# Patient Record
Sex: Female | Born: 1945
Health system: Southern US, Community
[De-identification: ages and names within clinical notes are randomized; demographics above are authoritative.]

## PROBLEM LIST (undated history)

## (undated) DIAGNOSIS — J189 Pneumonia, unspecified organism: Secondary | ICD-10-CM

## (undated) DIAGNOSIS — R011 Cardiac murmur, unspecified: Secondary | ICD-10-CM

## (undated) DIAGNOSIS — I10 Essential (primary) hypertension: Secondary | ICD-10-CM

## (undated) DIAGNOSIS — G4730 Sleep apnea, unspecified: Secondary | ICD-10-CM

## (undated) DIAGNOSIS — F419 Anxiety disorder, unspecified: Secondary | ICD-10-CM

## (undated) DIAGNOSIS — M1990 Unspecified osteoarthritis, unspecified site: Secondary | ICD-10-CM

## (undated) DIAGNOSIS — E039 Hypothyroidism, unspecified: Secondary | ICD-10-CM

## (undated) DIAGNOSIS — J45909 Unspecified asthma, uncomplicated: Secondary | ICD-10-CM

## (undated) DIAGNOSIS — E079 Disorder of thyroid, unspecified: Secondary | ICD-10-CM

## (undated) DIAGNOSIS — E7800 Pure hypercholesterolemia, unspecified: Secondary | ICD-10-CM

## (undated) DIAGNOSIS — C801 Malignant (primary) neoplasm, unspecified: Secondary | ICD-10-CM

## (undated) HISTORY — PX: EYE SURGERY: SHX253

## (undated) HISTORY — PX: OTHER SURGICAL HISTORY: SHX169

## (undated) HISTORY — PX: TUBAL LIGATION: SHX77

## (undated) HISTORY — PX: SHOULDER SURGERY: SHX246

## (undated) HISTORY — PX: BREAST BIOPSY: SHX20

---

## 2011-08-14 DIAGNOSIS — E559 Vitamin D deficiency, unspecified: Secondary | ICD-10-CM | POA: Insufficient documentation

## 2011-08-14 DIAGNOSIS — E119 Type 2 diabetes mellitus without complications: Secondary | ICD-10-CM | POA: Insufficient documentation

## 2011-08-14 DIAGNOSIS — I10 Essential (primary) hypertension: Secondary | ICD-10-CM | POA: Insufficient documentation

## 2011-08-14 DIAGNOSIS — R7303 Prediabetes: Secondary | ICD-10-CM | POA: Insufficient documentation

## 2011-08-14 DIAGNOSIS — I340 Nonrheumatic mitral (valve) insufficiency: Secondary | ICD-10-CM | POA: Insufficient documentation

## 2011-08-14 DIAGNOSIS — E785 Hyperlipidemia, unspecified: Secondary | ICD-10-CM | POA: Insufficient documentation

## 2011-08-14 DIAGNOSIS — G8929 Other chronic pain: Secondary | ICD-10-CM | POA: Insufficient documentation

## 2011-08-14 DIAGNOSIS — G47419 Narcolepsy without cataplexy: Secondary | ICD-10-CM | POA: Insufficient documentation

## 2011-08-14 DIAGNOSIS — F419 Anxiety disorder, unspecified: Secondary | ICD-10-CM | POA: Insufficient documentation

## 2011-08-14 DIAGNOSIS — E039 Hypothyroidism, unspecified: Secondary | ICD-10-CM | POA: Insufficient documentation

## 2011-12-12 DIAGNOSIS — M79673 Pain in unspecified foot: Secondary | ICD-10-CM | POA: Insufficient documentation

## 2011-12-12 DIAGNOSIS — G8929 Other chronic pain: Secondary | ICD-10-CM | POA: Insufficient documentation

## 2011-12-12 DIAGNOSIS — E669 Obesity, unspecified: Secondary | ICD-10-CM | POA: Insufficient documentation

## 2011-12-12 DIAGNOSIS — E66811 Obesity, class 1: Secondary | ICD-10-CM | POA: Insufficient documentation

## 2012-07-16 DIAGNOSIS — H2513 Age-related nuclear cataract, bilateral: Secondary | ICD-10-CM | POA: Insufficient documentation

## 2012-09-02 DIAGNOSIS — I872 Venous insufficiency (chronic) (peripheral): Secondary | ICD-10-CM | POA: Insufficient documentation

## 2012-10-02 DIAGNOSIS — C801 Malignant (primary) neoplasm, unspecified: Secondary | ICD-10-CM

## 2012-10-02 DIAGNOSIS — C4491 Basal cell carcinoma of skin, unspecified: Secondary | ICD-10-CM

## 2012-10-02 HISTORY — DX: Basal cell carcinoma of skin, unspecified: C44.91

## 2012-10-02 HISTORY — DX: Malignant (primary) neoplasm, unspecified: C80.1

## 2013-02-26 DIAGNOSIS — C44319 Basal cell carcinoma of skin of other parts of face: Secondary | ICD-10-CM | POA: Insufficient documentation

## 2013-10-08 DIAGNOSIS — R0609 Other forms of dyspnea: Secondary | ICD-10-CM | POA: Insufficient documentation

## 2013-10-08 DIAGNOSIS — R0600 Dyspnea, unspecified: Secondary | ICD-10-CM | POA: Insufficient documentation

## 2013-10-08 DIAGNOSIS — R002 Palpitations: Secondary | ICD-10-CM | POA: Insufficient documentation

## 2014-03-19 DIAGNOSIS — H52209 Unspecified astigmatism, unspecified eye: Secondary | ICD-10-CM | POA: Insufficient documentation

## 2015-05-25 DIAGNOSIS — G250 Essential tremor: Secondary | ICD-10-CM | POA: Insufficient documentation

## 2015-11-03 DIAGNOSIS — R259 Unspecified abnormal involuntary movements: Secondary | ICD-10-CM

## 2015-11-03 DIAGNOSIS — G252 Other specified forms of tremor: Secondary | ICD-10-CM | POA: Insufficient documentation

## 2015-11-03 DIAGNOSIS — F32A Depression, unspecified: Secondary | ICD-10-CM | POA: Insufficient documentation

## 2015-11-03 DIAGNOSIS — F329 Major depressive disorder, single episode, unspecified: Secondary | ICD-10-CM | POA: Insufficient documentation

## 2016-04-12 DIAGNOSIS — H43813 Vitreous degeneration, bilateral: Secondary | ICD-10-CM | POA: Insufficient documentation

## 2017-02-09 ENCOUNTER — Emergency Department
Admission: EM | Admit: 2017-02-09 | Discharge: 2017-02-09 | Disposition: A | Discharge status: Home / Self Care | Payer: Medicare Other | Attending: Emergency Medicine | Admitting: Emergency Medicine

## 2017-02-09 ENCOUNTER — Encounter: Payer: Self-pay | Admitting: Emergency Medicine

## 2017-02-09 DIAGNOSIS — L509 Urticaria, unspecified: Secondary | ICD-10-CM

## 2017-02-09 DIAGNOSIS — I10 Essential (primary) hypertension: Secondary | ICD-10-CM | POA: Diagnosis not present

## 2017-02-09 DIAGNOSIS — T7849XA Other allergy, initial encounter: Secondary | ICD-10-CM | POA: Diagnosis not present

## 2017-02-09 DIAGNOSIS — R21 Rash and other nonspecific skin eruption: Secondary | ICD-10-CM | POA: Diagnosis present

## 2017-02-09 DIAGNOSIS — L500 Allergic urticaria: Secondary | ICD-10-CM | POA: Diagnosis not present

## 2017-02-09 DIAGNOSIS — J45998 Other asthma: Secondary | ICD-10-CM | POA: Diagnosis not present

## 2017-02-09 DIAGNOSIS — T7840XA Allergy, unspecified, initial encounter: Secondary | ICD-10-CM

## 2017-02-09 HISTORY — DX: Pure hypercholesterolemia, unspecified: E78.00

## 2017-02-09 HISTORY — DX: Anxiety disorder, unspecified: F41.9

## 2017-02-09 HISTORY — DX: Essential (primary) hypertension: I10

## 2017-02-09 HISTORY — DX: Unspecified asthma, uncomplicated: J45.909

## 2017-02-09 HISTORY — DX: Disorder of thyroid, unspecified: E07.9

## 2017-02-09 MED ORDER — DIPHENHYDRAMINE HCL 25 MG PO CAPS
50.0000 mg | ORAL_CAPSULE | Freq: Once | ORAL | Status: AC
  Administered 2017-02-09: 50 mg via ORAL
  Filled 2017-02-09 (×2): qty 2

## 2017-02-09 MED ORDER — DIPHENHYDRAMINE HCL 25 MG PO CAPS: 25.0000 mg | ORAL_CAPSULE | Freq: Four times a day (QID) | ORAL | 0 refills | Status: DC

## 2017-02-09 MED ORDER — FAMOTIDINE 20 MG PO TABS
40.0000 mg | ORAL_TABLET | Freq: Once | ORAL | Status: AC
  Administered 2017-02-09: 40 mg via ORAL
  Filled 2017-02-09: qty 2

## 2017-02-09 NOTE — ED Notes (Signed)
Pt verbalized understanding of discharge instructions. NAD at this time. 

## 2017-02-09 NOTE — ED Triage Notes (Signed)
Pt reports took 2 slimvance pills 20 min prior to arrival. Now has generalized rash and itching everywhere.  Feels tingling sensation to skin.  Cheeks flushed. Pt denies dyspnea or dysphagia. No respiratory distress.  Speaking full sentences. handeling secretions.  No swelling to face.

## 2017-02-09 NOTE — ED Notes (Signed)
Pt informed to return to first nurse if sx worsens or starts having difficulty breathing or swallowing.

## 2017-02-09 NOTE — ED Provider Notes (Signed)
[facility] Emergency Department Provider Note  ____________________________________________  Time seen: Approximately 2:57 PM  I have reviewed the triage vital signs and the nursing notes.   HISTORY  Chief Complaint Allergic Reaction    HPI Wendy Beck is a 71 y.o. female who complains of itching and red rash that broke out on her scan shortly after 11 AM today. She took a new weight loss supplement called Terrance Mass at that time and quickly developed this itching reaction. She had never taken it before. Her only prior known allergy is for niacin that she was taking for hyperlipidemia. It sounds like she had a typical flushing reaction with this.  Denies abdominal pain nausea vomiting or diarrhea. No shortness of breath or wheezing. No cough or throat swelling. On arrival to the ED she was given antihistamines and feels back to normal.   Past Medical History:  Diagnosis Date  . Anxiety   . Asthma   . Hypercholesteremia   . Hypertension   . Thyroid disease      There are no active problems to display for this patient.    Past Surgical History:  Procedure Laterality Date  . SHOULDER SURGERY    . thumb surgery       Prior to Admission medications   Medication Sig Start Date End Date Taking? Authorizing Provider  diphenhydrAMINE (BENADRYL) 25 mg capsule Take 1 capsule (25 mg total) by mouth every 6 (six) hours. 02/09/17 02/12/17  Carrie Mew, MD     Allergies Niacin and related   History reviewed. No pertinent family history.  Social History Social History  Substance Use Topics  . Smoking status: Never Smoker  . Smokeless tobacco: Never Used  . Alcohol use No    Review of Systems  Constitutional:   No fever or chills.  ENT:   No sore throat. No rhinorrhea. Lymphatic: No swollen glands, No extremity swelling Endocrine: No hot/cold flashes. No significant weight change. No neck swelling. Cardiovascular:   No chest pain or  syncope. Respiratory:   No dyspnea or cough. Gastrointestinal:   Negative for abdominal pain, vomiting and diarrhea.  Genitourinary:   Negative for dysuria or difficulty urinating. Musculoskeletal:   Negative for focal pain or swelling Neurological:   Negative for headaches or weakness. All other systems reviewed and are negative except as documented above in ROS and HPI.  ____________________________________________   PHYSICAL EXAM:  VITAL SIGNS: ED Triage Vitals  Enc Vitals Group     BP 02/09/17 1157 (!) 189/75     Pulse Rate 02/09/17 1154 (!) 57     Resp 02/09/17 1154 18     Temp 02/09/17 1154 97.8 F (36.6 C)     Temp Source 02/09/17 1154 Oral     SpO2 02/09/17 1154 99 %     Weight 02/09/17 1154 180 lb (81.6 kg)     Height 02/09/17 1154 5\' 2"  (1.575 m)     Head Circumference --      Peak Flow --      Pain Score --      Pain Loc --      Pain Edu? --      Excl. in [place]? --     Vital signs reviewed, nursing assessments reviewed.   Constitutional:   Alert and oriented. Well appearing and in no distress. Eyes:   No scleral icterus. No conjunctival pallor. PERRL. EOMI.  No nystagmus. ENT   Head:   Normocephalic and atraumatic.   Nose:  No congestion/rhinnorhea. No septal hematoma   Mouth/Throat:   MMM, no pharyngeal erythema. No peritonsillar mass.    Neck:   No stridor. No SubQ emphysema. No meningismus. Hematological/Lymphatic/Immunilogical:   No cervical lymphadenopathy. Cardiovascular:   RRR. Symmetric bilateral radial and DP pulses.  No murmurs.  Respiratory:   Normal respiratory effort without tachypnea nor retractions. Breath sounds are clear and equal bilaterally. No wheezes/rales/rhonchi.No inducible wheezing with forceful expiration Gastrointestinal:   Soft and nontender. Non distended. There is no CVA tenderness.  No rebound, rigidity, or guarding.Normoactive bowel sounds Genitourinary:   deferred Musculoskeletal:   Normal range of motion in all  extremities. No joint effusions.  No lower extremity tenderness.  No edema. Neurologic:   Normal speech and language.  CN 2-10 normal. Motor grossly intact. No gross focal neurologic deficits are appreciated.  Skin:    Skin is warm, dry and intact. No rash noted.  No petechiae, purpura, or bullae.  ____________________________________________    LABS (pertinent positives/negatives) (all labs ordered are listed, but only abnormal results are displayed) Labs Reviewed - No data to display ____________________________________________   EKG    ____________________________________________    RADIOLOGY  No results found.  ____________________________________________   PROCEDURES Procedures  ____________________________________________   INITIAL IMPRESSION / ASSESSMENT AND PLAN / ED COURSE  Pertinent labs & imaging results that were available during my care of the patient were reviewed by me and considered in my medical decision making (see chart for details).  Patient is well-appearing no acute distress. Symptoms have resolved with Benadryl and famotidine. Exam is reassuring and shows no evidence of hypersensitivity reaction at this time. So appears to been in very limited mild reaction which was easily resolved with antihistamines. Patient will stop taking this medication. I recommended a period of observation in the ED to ensure she does not have any relapse of symptoms, but she refuses. She wishes to be discharged right away. Given the minor degree to which her symptoms affected her and how easily there controlled think this is reasonable, I'll discharge her to continue on Benadryl for the next several days. Return precautions given. I don't think she would be at risk of a severe reaction like anaphylaxis.         ____________________________________________   FINAL CLINICAL IMPRESSION(S) / ED DIAGNOSES  Final diagnoses:  Allergic reaction, initial encounter   Urticaria      New Prescriptions   DIPHENHYDRAMINE (BENADRYL) 25 MG CAPSULE    Take 1 capsule (25 mg total) by mouth every 6 (six) hours.     Portions of this note were generated with dragon dictation software. Dictation errors may occur despite best attempts at proofreading.    Carrie Mew, MD 02/09/17 1500

## 2018-01-14 ENCOUNTER — Other Ambulatory Visit: Payer: Self-pay | Admitting: Physician Assistant

## 2018-01-14 DIAGNOSIS — M2391 Unspecified internal derangement of right knee: Secondary | ICD-10-CM

## 2018-01-24 ENCOUNTER — Ambulatory Visit
Admission: RE | Admit: 2018-01-24 | Discharge: 2018-01-24 | Disposition: A | Discharge status: Home / Self Care | Payer: Medicare Other | Source: Ambulatory Visit | Attending: Physician Assistant | Admitting: Physician Assistant

## 2018-01-24 DIAGNOSIS — M2391 Unspecified internal derangement of right knee: Secondary | ICD-10-CM

## 2018-01-24 DIAGNOSIS — M1711 Unilateral primary osteoarthritis, right knee: Secondary | ICD-10-CM | POA: Insufficient documentation

## 2018-01-24 DIAGNOSIS — S83221A Peripheral tear of medial meniscus, current injury, right knee, initial encounter: Secondary | ICD-10-CM | POA: Insufficient documentation

## 2018-01-24 DIAGNOSIS — X58XXXA Exposure to other specified factors, initial encounter: Secondary | ICD-10-CM | POA: Insufficient documentation

## 2018-03-22 ENCOUNTER — Encounter
Admission: RE | Admit: 2018-03-22 | Discharge: 2018-03-22 | Disposition: A | Discharge status: Home / Self Care | Payer: Medicare Other | Source: Ambulatory Visit | Attending: Orthopedic Surgery | Admitting: Orthopedic Surgery

## 2018-03-22 DIAGNOSIS — I10 Essential (primary) hypertension: Secondary | ICD-10-CM | POA: Diagnosis not present

## 2018-03-22 DIAGNOSIS — Z01812 Encounter for preprocedural laboratory examination: Secondary | ICD-10-CM | POA: Diagnosis present

## 2018-03-22 DIAGNOSIS — Z01810 Encounter for preprocedural cardiovascular examination: Secondary | ICD-10-CM | POA: Insufficient documentation

## 2018-03-22 HISTORY — DX: Cardiac murmur, unspecified: R01.1

## 2018-03-22 HISTORY — DX: Sleep apnea, unspecified: G47.30

## 2018-03-22 HISTORY — DX: Unspecified osteoarthritis, unspecified site: M19.90

## 2018-03-22 HISTORY — DX: Hypothyroidism, unspecified: E03.9

## 2018-03-22 HISTORY — DX: Malignant (primary) neoplasm, unspecified: C80.1

## 2018-03-22 HISTORY — DX: Pneumonia, unspecified organism: J18.9

## 2018-03-22 LAB — CBC
HCT: 41.9 % (ref 35.0–47.0)
Hemoglobin: 14.4 g/dL (ref 12.0–16.0)
MCH: 30.6 pg (ref 26.0–34.0)
MCHC: 34.4 g/dL (ref 32.0–36.0)
MCV: 89 fL (ref 80.0–100.0)
Platelets: 202 10*3/uL (ref 150–440)
RBC: 4.71 MIL/uL (ref 3.80–5.20)
RDW: 13.3 % (ref 11.5–14.5)
WBC: 7.5 10*3/uL (ref 3.6–11.0)

## 2018-03-22 NOTE — Patient Instructions (Signed)
  Your procedure is scheduled on: Friday 03/29/2018 Report to Same Day Surgery 2nd floor medical mall [facility] Entrance-take elevator on left to 2nd floor.  Check in with surgery information desk.) To find out your arrival time please call [phone] between 1PM - 3PM on Thursday 03/28/2018  Remember: Instructions that are not followed completely may result in serious medical risk, up to and including death, or upon the discretion of your surgeon and anesthesiologist your surgery may need to be rescheduled.    _x___ 1. Do not eat food after midnight the night before your procedure. You may drink clear liquids up to 2 hours before you are scheduled to arrive at the hospital for your procedure.  Do not drink clear liquids within 2 hours of your scheduled arrival to the hospital.  Clear liquids include  --Water or Apple juice without pulp  --Clear carbohydrate beverage such as Gatorade  --Black Coffee or Clear Tea (No milk, no creamers, do not add anything to the coffee or tea Type 1 and type 2 diabetics should only drink water.  No gum chewing or hard candies.      __x__ 2. No Alcohol for 24 hours before or after surgery.   __x__3. No Smoking or e-cigarettes for 24 prior to surgery.  Do not use any chewable tobacco products for at least 6 hour prior to surgery   ____  4. Bring all medications with you on the day of surgery if instructed.    __x__ 5. Notify your doctor if there is any change in your medical condition     (cold, fever, infections).   __x__6. On the morning of surgery brush your teeth with toothpaste and water.  You may rinse your mouth with mouth wash if you wish.  Do not swallow any toothpaste or mouthwash.   Do not wear jewelry, make-up, hairpins, clips or nail polish.  Do not wear lotions, powders, deodorant, or perfumes.   Do not shave 48 hours prior to surgery.   Do not bring valuables to the hospital.    [facility] is not responsible for any belongings or  valuables.               Contacts, dentures or bridgework may not be worn into surgery.  Leave your suitcase in the car. After surgery it may be brought to your room.  Please read over the following fact sheets that you were given:   [facility] Preparing for Surgery and or MRSA Information   _x___ Take anti-hypertensive listed below, cardiac, seizure, asthma, anti-reflux and psychiatric medicines. These include:  1. Levothyroxine/Synthroid  2. Fluoxetine/Prozac  3. Pitavastatin/Livalo  Do not take bisoprolol-HCTZ (Ziac) on the day of surgery.  _x___ Use CHG Soap or sage wipes as directed on instruction sheet   _x___ Follow recommendations from Cardiologist, Pulmonologist or PCP regarding stopping Aspirin, Coumadin, Plavix ,Eliquis, Effient, or Pradaxa, and Pletal.  _x___Stop Anti-inflammatories such as Advil, Aleve, Ibuprofen, Motrin, Naproxen, Naprosyn, Goodies powders or aspirin products. OK to take Tylenol and Celebrex.   _x___ Stop supplements (CoQ10, Glucosamine/Dona, Hair Vitamins, Fish Oil, Zyflamend) NOW until after surgery.  You may continue Vitamin D, and Vitamin B.

## 2018-03-29 ENCOUNTER — Ambulatory Visit: Payer: Medicare Other | Admitting: Anesthesiology

## 2018-03-29 ENCOUNTER — Other Ambulatory Visit: Payer: Self-pay

## 2018-03-29 ENCOUNTER — Encounter
Admission: RE | Disposition: A | Discharge status: Home / Self Care | Payer: Self-pay | Source: Ambulatory Visit | Attending: Orthopedic Surgery

## 2018-03-29 ENCOUNTER — Encounter: Payer: Self-pay | Admitting: Orthopedic Surgery

## 2018-03-29 ENCOUNTER — Ambulatory Visit
Admission: RE | Admit: 2018-03-29 | Discharge: 2018-03-29 | Disposition: A | Discharge status: Home / Self Care | Payer: Medicare Other | Source: Ambulatory Visit | Attending: Orthopedic Surgery | Admitting: Orthopedic Surgery

## 2018-03-29 DIAGNOSIS — E785 Hyperlipidemia, unspecified: Secondary | ICD-10-CM | POA: Diagnosis not present

## 2018-03-29 DIAGNOSIS — F419 Anxiety disorder, unspecified: Secondary | ICD-10-CM | POA: Insufficient documentation

## 2018-03-29 DIAGNOSIS — F329 Major depressive disorder, single episode, unspecified: Secondary | ICD-10-CM | POA: Insufficient documentation

## 2018-03-29 DIAGNOSIS — Y92099 Unspecified place in other non-institutional residence as the place of occurrence of the external cause: Secondary | ICD-10-CM | POA: Diagnosis not present

## 2018-03-29 DIAGNOSIS — S83241A Other tear of medial meniscus, current injury, right knee, initial encounter: Secondary | ICD-10-CM | POA: Diagnosis not present

## 2018-03-29 DIAGNOSIS — I10 Essential (primary) hypertension: Secondary | ICD-10-CM | POA: Diagnosis not present

## 2018-03-29 DIAGNOSIS — M94261 Chondromalacia, right knee: Secondary | ICD-10-CM | POA: Insufficient documentation

## 2018-03-29 DIAGNOSIS — E039 Hypothyroidism, unspecified: Secondary | ICD-10-CM | POA: Diagnosis not present

## 2018-03-29 DIAGNOSIS — W109XXA Fall (on) (from) unspecified stairs and steps, initial encounter: Secondary | ICD-10-CM | POA: Diagnosis not present

## 2018-03-29 DIAGNOSIS — Z9889 Other specified postprocedural states: Secondary | ICD-10-CM

## 2018-03-29 DIAGNOSIS — Z79899 Other long term (current) drug therapy: Secondary | ICD-10-CM | POA: Diagnosis not present

## 2018-03-29 HISTORY — PX: KNEE ARTHROSCOPY: SHX127

## 2018-03-29 SURGERY — ARTHROSCOPY KNEE
Anesthesia: General | Site: Knee | Laterality: Right | Wound class: Clean

## 2018-03-29 MED ORDER — FENTANYL CITRATE (PF) 100 MCG/2ML IJ SOLN
INTRAMUSCULAR | Status: AC
  Filled 2018-03-29: qty 2

## 2018-03-29 MED ORDER — LIDOCAINE HCL (CARDIAC) PF 100 MG/5ML IV SOSY
PREFILLED_SYRINGE | INTRAVENOUS | Status: DC | PRN
  Administered 2018-03-29: 60 mg via INTRAVENOUS

## 2018-03-29 MED ORDER — OXYCODONE HCL 5 MG PO TABS
ORAL_TABLET | ORAL | Status: AC
  Filled 2018-03-29: qty 1

## 2018-03-29 MED ORDER — FAMOTIDINE 20 MG PO TABS
ORAL_TABLET | ORAL | Status: AC
  Administered 2018-03-29: 20 mg via ORAL
  Filled 2018-03-29: qty 1

## 2018-03-29 MED ORDER — FAMOTIDINE 20 MG PO TABS
20.0000 mg | ORAL_TABLET | Freq: Once | ORAL | Status: AC
  Administered 2018-03-29: 20 mg via ORAL

## 2018-03-29 MED ORDER — LACTATED RINGERS IV SOLN
INTRAVENOUS | Status: DC
  Administered 2018-03-29 (×2): via INTRAVENOUS

## 2018-03-29 MED ORDER — FENTANYL CITRATE (PF) 100 MCG/2ML IJ SOLN
25.0000 ug | INTRAMUSCULAR | Status: DC | PRN
  Administered 2018-03-29 (×2): 25 ug via INTRAVENOUS

## 2018-03-29 MED ORDER — BUPIVACAINE-EPINEPHRINE (PF) 0.25% -1:200000 IJ SOLN
INTRAMUSCULAR | Status: AC
  Filled 2018-03-29: qty 30

## 2018-03-29 MED ORDER — ACETAMINOPHEN 10 MG/ML IV SOLN
INTRAVENOUS | Status: AC
  Filled 2018-03-29: qty 100

## 2018-03-29 MED ORDER — MIDAZOLAM HCL 2 MG/2ML IJ SOLN
INTRAMUSCULAR | Status: DC | PRN
  Administered 2018-03-29: 2 mg via INTRAVENOUS

## 2018-03-29 MED ORDER — MORPHINE SULFATE (PF) 4 MG/ML IV SOLN
INTRAVENOUS | Status: AC
  Filled 2018-03-29: qty 1

## 2018-03-29 MED ORDER — FENTANYL CITRATE (PF) 100 MCG/2ML IJ SOLN
INTRAMUSCULAR | Status: AC
  Administered 2018-03-29: 25 ug via INTRAVENOUS
  Filled 2018-03-29: qty 2

## 2018-03-29 MED ORDER — PROPOFOL 10 MG/ML IV BOLUS
INTRAVENOUS | Status: AC
  Filled 2018-03-29: qty 20

## 2018-03-29 MED ORDER — DEXAMETHASONE SODIUM PHOSPHATE 10 MG/ML IJ SOLN
INTRAMUSCULAR | Status: DC | PRN
  Administered 2018-03-29: 4 mg via INTRAVENOUS

## 2018-03-29 MED ORDER — FENTANYL CITRATE (PF) 100 MCG/2ML IJ SOLN
INTRAMUSCULAR | Status: DC | PRN
  Administered 2018-03-29: 50 ug via INTRAVENOUS
  Administered 2018-03-29: 25 ug via INTRAVENOUS
  Administered 2018-03-29 (×2): 50 ug via INTRAVENOUS
  Administered 2018-03-29: 25 ug via INTRAVENOUS

## 2018-03-29 MED ORDER — OXYCODONE HCL 5 MG PO TABS
5.0000 mg | ORAL_TABLET | Freq: Once | ORAL | Status: AC | PRN
  Administered 2018-03-29: 5 mg via ORAL

## 2018-03-29 MED ORDER — ACETAMINOPHEN 10 MG/ML IV SOLN
INTRAVENOUS | Status: DC | PRN
  Administered 2018-03-29: 1000 mg via INTRAVENOUS

## 2018-03-29 MED ORDER — MORPHINE SULFATE 4 MG/ML IJ SOLN
INTRAMUSCULAR | Status: DC | PRN
  Administered 2018-03-29: 4 mg

## 2018-03-29 MED ORDER — EPHEDRINE SULFATE 50 MG/ML IJ SOLN
INTRAMUSCULAR | Status: DC | PRN
  Administered 2018-03-29: 5 mg via INTRAVENOUS
  Administered 2018-03-29: 10 mg via INTRAVENOUS

## 2018-03-29 MED ORDER — BUPIVACAINE-EPINEPHRINE 0.25% -1:200000 IJ SOLN
INTRAMUSCULAR | Status: DC | PRN
  Administered 2018-03-29: 25 mL
  Administered 2018-03-29: 5 mL

## 2018-03-29 MED ORDER — HYDROCODONE-ACETAMINOPHEN 5-325 MG PO TABS: 1.0000 | ORAL_TABLET | ORAL | 0 refills | Status: DC | PRN

## 2018-03-29 MED ORDER — OXYCODONE HCL 5 MG/5ML PO SOLN: 5.0000 mg | Freq: Once | ORAL | Status: AC | PRN

## 2018-03-29 MED ORDER — CHLORHEXIDINE GLUCONATE 4 % EX LIQD: 60.0000 mL | Freq: Once | CUTANEOUS | Status: DC

## 2018-03-29 MED ORDER — ONDANSETRON HCL 4 MG/2ML IJ SOLN
INTRAMUSCULAR | Status: DC | PRN
  Administered 2018-03-29: 4 mg via INTRAVENOUS

## 2018-03-29 MED ORDER — PROPOFOL 10 MG/ML IV BOLUS
INTRAVENOUS | Status: DC | PRN
  Administered 2018-03-29: 60 mg via INTRAVENOUS

## 2018-03-29 MED ORDER — MIDAZOLAM HCL 2 MG/2ML IJ SOLN
INTRAMUSCULAR | Status: AC
  Filled 2018-03-29: qty 2

## 2018-03-29 SURGICAL SUPPLY — 21 items
BLADE SHAVER 4.5 DBL SERAT CV (CUTTER)
CUFF TOURN 24 STER (MISCELLANEOUS) ×2
CUFF TOURN 30 STER DUAL PORT (MISCELLANEOUS)
DRSG DERMACEA 8X12 NADH (GAUZE/BANDAGES/DRESSINGS) ×2
DURAPREP 26ML APPLICATOR (WOUND CARE) ×4
GAUZE SPONGE 4X4 12PLY STRL (GAUZE/BANDAGES/DRESSINGS) ×2
GLOVE BIOGEL M STRL SZ7.5 (GLOVE) ×2
GLOVE INDICATOR 8.0 STRL GRN (GLOVE) ×2
GOWN STRL REUS W/ TWL LRG LVL3 (GOWN DISPOSABLE) ×2
GOWN STRL REUS W/TWL LRG LVL3 (GOWN DISPOSABLE) ×2
IV LACTATED RINGER IRRG 3000ML (IV SOLUTION) ×6
IV LR IRRIG 3000ML ARTHROMATIC (IV SOLUTION) ×6
KIT TURNOVER KIT A (KITS) ×2
MANIFOLD NEPTUNE II (INSTRUMENTS) ×2
PACK ARTHROSCOPY KNEE (MISCELLANEOUS) ×2
SET TUBE SUCT SHAVER OUTFL 24K (TUBING) ×2
SET TUBE TIP INTRA-ARTICULAR (MISCELLANEOUS) ×2
SUT ETHILON 3-0 FS-10 30 BLK (SUTURE) ×2
TUBING ARTHRO INFLOW-ONLY STRL (TUBING) ×2
WAND HAND CNTRL MULTIVAC 50 (MISCELLANEOUS) ×2
WRAP KNEE W/COLD PACKS 25.5X14 (SOFTGOODS) ×2

## 2018-03-29 NOTE — Anesthesia Post-op Follow-up Note (Signed)
Anesthesia QCDR form completed.        

## 2018-03-29 NOTE — Progress Notes (Signed)
Right knee elevated on pillows  Ice wrap on  Pedal pulse positive to right foot  Can wiggle right extremety

## 2018-03-29 NOTE — H&P (Signed)
The patient has been re-examined, and the chart reviewed, and there have been no interval changes to the documented history and physical.    The risks, benefits, and alternatives have been discussed at length. The patient expressed understanding of the risks benefits and agreed with plans for surgical intervention.  James P. Hooten, Jr. M.D.    

## 2018-03-29 NOTE — OR Nursing (Signed)
Patient instructed on use of incentive spirometer with return demonstration. 

## 2018-03-29 NOTE — Discharge Instructions (Signed)
°  Instructions after Knee Arthroscopy  ° ° Wendy Beck, Jr., M.D.    ° Dept. of Orthopaedics & Sports Medicine ° [facility] ° [address] ° [place], [place]  [address] ° ° Phone: [phone]   Fax: [phone] ° ° °DIET: °• Drink plenty of non-alcoholic fluids & begin a light diet. °• Resume your normal diet the day after surgery. ° °ACTIVITY:  °• You may use crutches or a walker with weight-bearing as tolerated, unless instructed otherwise. °• You may wean yourself off of the walker or crutches as tolerated.  °• Begin doing gentle exercises. Exercising will reduce the pain and swelling, increase motion, and prevent muscle weakness.   °• Avoid strenuous activities or athletics for a minimum of 4-6 weeks after arthroscopic surgery. °• Do not drive or operate any equipment until instructed. ° °WOUND CARE:  °• Place one to two pillows under the knee the first day or two when sitting or lying.  °• Continue to use the ice packs periodically to reduce pain and swelling. °• The small incisions in your knee are closed with nylon stitches. The stitches will be removed in the office. °• The bulky dressing may be removed on the second day after surgery. DO NOT TOUCH THE STITCHES. Put a Band-Aid over each stitch. Do NOT use any ointments or creams on the incisions.  °• You may bathe or shower after the stitches are removed at the first office visit following surgery. ° °MEDICATIONS: °• You may resume your regular medications. °• Please take the pain medication as prescribed. °• Do not take pain medication on an empty stomach. °• Do not drive or drink alcoholic beverages when taking pain medications. ° °CALL THE OFFICE FOR: °• Temperature above 101 degrees °• Excessive bleeding or drainage on the dressing. °• Excessive swelling, coldness, or paleness of the toes. °• Persistent nausea and vomiting. ° °FOLLOW-UP:  °• You should have an appointment to return to the office in 7-10 days after surgery.  °  °

## 2018-03-29 NOTE — Op Note (Signed)
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   ASSISTANT: none  ANESTHESIA: general  ESTIMATED BLOOD LOSS: Minimal  FLUIDS REPLACED: 800 mL of crystalloid  TOURNIQUET TIME: Not used  DRAINS: none  IMPLANTS UTILIZED: None  INDICATIONS FOR SURGERY: Wendy Beck is a 72 y.o. year old female who has been seen for complaints of right knee pain. MRI demonstrated findings consistent with meniscal pathology. After discussion of the risks and benefits of surgical intervention, the patient expressed understanding of the risks benefits and agree with plans for right knee arthroscopy.   PROCEDURE IN DETAIL: The patient was brought into the operating room and, after adequate spinal anesthesia was achieved, a tourniquet was applied to the right thigh and the leg was placed in the leg holder. All bony prominences were well padded. The patient's right knee was cleaned and prepped with alcohol and Duraprep and draped in the usual sterile fashion. A "timeout" was performed as per usual protocol. The anticipated portal sites were injected with 0.25% Marcaine with epinephrine. An anterolateral incision was made and a cannula was inserted. A large effusion was evacuated and the knee was distended with fluid using the pump. The scope was advanced down the medial gutter into the medial compartment. Under visualization with the scope, an anteromedial portal was created and a hooked probe was inserted. The medial meniscus was visualized and probed.  There was a complex tear of the posterior horn of the  medial meniscus.  The tear was debrided using meniscal punches and a 4.5 mm incisor shaver.  Final contouring was performed using the 50 degree ArthroCare wand.  The remaining portion of meniscus was visualized and probed and felt to be stable.  The articular cartilage was visualized.  There were some grade 3 changes of chondromalacia involving the medial femoral condyle.  These areas were debrided and contoured using the ArthroCare wand.  The scope was then advanced into the intercondylar notch. The anterior cruciate ligament was visualized and probed and felt to be intact. The scope was removed from the lateral portal and reinserted via the anteromedial portal to better visualize the lateral compartment. The lateral meniscus was visualized and probed.  The lateral meniscus was intact without evidence of significant tear or instability.  The articular cartilage of the lateral compartment was visualized and noted to be in good condition.  Finally, the scope was advanced so as to visualize the patellofemoral articulation. Good patellar tracking was appreciated.   The knee was irrigated with copius amounts of fluid and suctioned dry. The anterolateral portal was re-approximated with #3-0 nylon. A combination of 0.25% Marcaine with epinephrine and 4 mg of Morphine were injected via the scope. The scope was removed and the anteromedial portal was re-approximated with #3-0 nylon. A sterile dressing was applied followed by application of an ice wrap.  The patient tolerated the procedure well and was transported to the PACU in stable condition.  James P. Holley Bouche., M.D.

## 2018-03-29 NOTE — Anesthesia Preprocedure Evaluation (Signed)
Anesthesia Evaluation  Patient identified by MRN, date of birth, ID band Patient awake    Reviewed: Allergy & Precautions, H&P , NPO status , Patient's Chart, lab work & pertinent test results  History of Anesthesia Complications (+) history of anesthetic complications (broken teeth)  Airway Mallampati: III  TM Distance: <3 FB Neck ROM: limited    Dental  (+) Chipped, Poor Dentition, Caps, Implants   Pulmonary neg shortness of breath, asthma , sleep apnea , pneumonia,           Cardiovascular Exercise Tolerance: Good hypertension, (-) DOE + Valvular Problems/Murmurs      Neuro/Psych PSYCHIATRIC DISORDERS Anxiety Depression negative neurological ROS     GI/Hepatic negative GI ROS, Neg liver ROS,   Endo/Other  Hypothyroidism   Renal/GU      Musculoskeletal  (+) Arthritis ,   Abdominal   Peds  Hematology negative hematology ROS (+)   Anesthesia Other Findings Past Medical History: No date: Anxiety No date: Arthritis No date: Asthma 2014: Cancer ([place])     Comment:  BCC forehead No date: Heart murmur     Comment:  as a child, and currently No date: Hypercholesteremia No date: Hypertension No date: Hypothyroidism No date: Pneumonia No date: Sleep apnea No date: Thyroid disease  Past Surgical History: No date: SHOULDER SURGERY No date: thumb surgery  BMI    Body Mass Index:  32.98 kg/m      Reproductive/Obstetrics negative OB ROS                             Anesthesia Physical Anesthesia Plan  ASA: III  Anesthesia Plan: General LMA   Post-op Pain Management:    Induction: Intravenous  PONV Risk Score and Plan: Ondansetron, Dexamethasone, Midazolam and Treatment may vary due to age or medical condition  Airway Management Planned: LMA  Additional Equipment:   Intra-op Plan:   Post-operative Plan: Extubation in OR  Informed Consent: I have reviewed the patients  History and Physical, chart, labs and discussed the procedure including the risks, benefits and alternatives for the proposed anesthesia with the patient or authorized representative who has indicated his/her understanding and acceptance.   Dental Advisory Given  Plan Discussed with: Anesthesiologist, CRNA and Surgeon  Anesthesia Plan Comments: (Patient consented for risks of anesthesia including but not limited to:  - adverse reactions to medications - damage to teeth, lips or other oral mucosa - sore throat or hoarseness - Damage to heart, brain, lungs or loss of life  Patient voiced understanding.)        Anesthesia Quick Evaluation

## 2018-03-29 NOTE — Anesthesia Postprocedure Evaluation (Signed)
Anesthesia Post Note  Patient: Wendy Beck  Procedure(s) Performed: ARTHROSCOPY KNEE, PARTIAL MEDIAL MENISECTOMY, MEDIAL CHONDROPLASTY (Right Knee)  Patient location during evaluation: PACU Anesthesia Type: General Level of consciousness: awake and alert Pain management: pain level controlled Vital Signs Assessment: post-procedure vital signs reviewed and stable Respiratory status: spontaneous breathing, nonlabored ventilation, respiratory function stable and patient connected to nasal cannula oxygen Cardiovascular status: blood pressure returned to baseline and stable Postop Assessment: no apparent nausea or vomiting Anesthetic complications: no     Last Vitals:  Vitals:   03/29/18 0957 03/29/18 1029  BP: 129/74 126/64  Pulse: 70 65  Resp: 16   Temp: (!) 36.2 C   SpO2: 94% 93%    Last Pain:  Vitals:   03/29/18 1029  TempSrc:   PainSc: 5                  Precious Haws Takima [place]

## 2018-03-29 NOTE — Transfer of Care (Signed)
Immediate Anesthesia Transfer of Care Note  Patient: Wendy Beck  Procedure(s) Performed: ARTHROSCOPY KNEE, PARTIAL MEDIAL MENISECTOMY, MEDIAL CHONDROPLASTY (Right Knee)  Patient Location: PACU  Anesthesia Type:General  Level of Consciousness: awake  Airway & Oxygen Therapy: Patient Spontanous Breathing  Post-op Assessment: Report given to RN  Post vital signs: stable  Last Vitals:  Vitals Value Taken Time  BP 132/54 03/29/2018  8:54 AM  Temp    Pulse 83 03/29/2018  8:54 AM  Resp 11 03/29/2018  8:54 AM  SpO2 100 % 03/29/2018  8:54 AM  Vitals shown include unvalidated device data.  Last Pain:  Vitals:   03/29/18 0601  TempSrc:   PainSc: 0-No pain         Complications: No apparent anesthesia complications

## 2019-03-31 ENCOUNTER — Other Ambulatory Visit: Payer: Self-pay

## 2019-03-31 ENCOUNTER — Emergency Department
Admission: EM | Admit: 2019-03-31 | Discharge: 2019-03-31 | Disposition: A | Discharge status: Home / Self Care | Payer: Medicare Other | Attending: Emergency Medicine | Admitting: Emergency Medicine

## 2019-03-31 ENCOUNTER — Emergency Department: Payer: Medicare Other

## 2019-03-31 DIAGNOSIS — R1013 Epigastric pain: Secondary | ICD-10-CM | POA: Diagnosis not present

## 2019-03-31 DIAGNOSIS — Z79899 Other long term (current) drug therapy: Secondary | ICD-10-CM | POA: Diagnosis not present

## 2019-03-31 DIAGNOSIS — I10 Essential (primary) hypertension: Secondary | ICD-10-CM | POA: Diagnosis not present

## 2019-03-31 DIAGNOSIS — Z85828 Personal history of other malignant neoplasm of skin: Secondary | ICD-10-CM | POA: Insufficient documentation

## 2019-03-31 DIAGNOSIS — J45909 Unspecified asthma, uncomplicated: Secondary | ICD-10-CM | POA: Insufficient documentation

## 2019-03-31 DIAGNOSIS — E039 Hypothyroidism, unspecified: Secondary | ICD-10-CM | POA: Insufficient documentation

## 2019-03-31 LAB — COMPREHENSIVE METABOLIC PANEL WITH GFR
ALT: 70 U/L — ABNORMAL HIGH (ref 0–44)
AST: 64 U/L — ABNORMAL HIGH (ref 15–41)
Albumin: 3.2 g/dL — ABNORMAL LOW (ref 3.5–5.0)
Alkaline Phosphatase: 96 U/L (ref 38–126)
Anion gap: 9 (ref 5–15)
BUN: 17 mg/dL (ref 8–23)
CO2: 24 mmol/L (ref 22–32)
Calcium: 8.5 mg/dL — ABNORMAL LOW (ref 8.9–10.3)
Chloride: 100 mmol/L (ref 98–111)
Creatinine, Ser: 0.89 mg/dL (ref 0.44–1.00)
GFR calc Af Amer: >60 mL/min
GFR calc non Af Amer: >60 mL/min
Glucose, Bld: 169 mg/dL — ABNORMAL HIGH (ref 70–99)
Potassium: 3.4 mmol/L — ABNORMAL LOW (ref 3.5–5.1)
Sodium: 133 mmol/L — ABNORMAL LOW (ref 135–145)
Total Bilirubin: 1.1 mg/dL (ref 0.3–1.2)
Total Protein: 6.6 g/dL (ref 6.5–8.1)

## 2019-03-31 LAB — CBC WITH DIFFERENTIAL/PLATELET
Abs Immature Granulocytes: 0.05 10*3/uL (ref 0.00–0.07)
Basophils Absolute: 0 10*3/uL (ref 0.0–0.1)
Basophils Relative: 0 %
Eosinophils Absolute: 0.2 10*3/uL (ref 0.0–0.5)
Eosinophils Relative: 2 %
HCT: 39.1 % (ref 36.0–46.0)
Hemoglobin: 13.4 g/dL (ref 12.0–15.0)
Immature Granulocytes: 1 %
Lymphocytes Relative: 11 %
Lymphs Abs: 1.2 10*3/uL (ref 0.7–4.0)
MCH: 30.3 pg (ref 26.0–34.0)
MCHC: 34.3 g/dL (ref 30.0–36.0)
MCV: 88.5 fL (ref 80.0–100.0)
Monocytes Absolute: 0.9 10*3/uL (ref 0.1–1.0)
Monocytes Relative: 8 %
Neutro Abs: 8.5 10*3/uL — ABNORMAL HIGH (ref 1.7–7.7)
Neutrophils Relative %: 78 %
Platelets: 187 10*3/uL (ref 150–400)
RBC: 4.42 MIL/uL (ref 3.87–5.11)
RDW: 12.6 % (ref 11.5–15.5)
WBC: 10.8 10*3/uL — ABNORMAL HIGH (ref 4.0–10.5)
nRBC: 0 % (ref 0.0–0.2)

## 2019-03-31 LAB — LIPASE, BLOOD: Lipase: 50 U/L (ref 11–51)

## 2019-03-31 MED ORDER — FENTANYL CITRATE (PF) 100 MCG/2ML IJ SOLN
50.0000 ug | Freq: Once | INTRAMUSCULAR | Status: AC
  Administered 2019-03-31: 50 ug via INTRAVENOUS
  Filled 2019-03-31: qty 2

## 2019-03-31 MED ORDER — SUCRALFATE 1 G PO TABS: 1.0000 g | ORAL_TABLET | Freq: Four times a day (QID) | ORAL | 0 refills | Status: DC

## 2019-03-31 MED ORDER — SODIUM CHLORIDE 0.9 % IV SOLN
Freq: Once | INTRAVENOUS | Status: AC
  Administered 2019-03-31: 09:00:00 via INTRAVENOUS

## 2019-03-31 MED ORDER — FAMOTIDINE 20 MG PO TABS: 20.0000 mg | ORAL_TABLET | Freq: Every day | ORAL | 1 refills | Status: DC

## 2019-03-31 NOTE — ED Provider Notes (Signed)
[facility] Emergency Department Provider Note   ____________________________________________   I have reviewed the triage vital signs and the nursing notes.   HISTORY  Chief Complaint Abdominal Pain   History limited by: Not Limited   HPI Wendy Beck is a 73 y.o. female who presents to the emergency department today because of concern for epigastric pain. The patient states that the pain started 4 days ago. It does radiate to her back. The patient states that her appetite has been severely decreased as well. When she does try to eat the pain is much worse. Has not had a bowel movement during this time but has been passing gas. Denies any fevers. Has never had pain like this before. Denies history of abdominal surgeries in the past.    Records reviewed. Per medical record review patient has a history of HTN, HLD.   Past Medical History:  Diagnosis Date  . Anxiety   . Arthritis   . Asthma   . Cancer ([place]) 2014   BCC forehead  . Heart murmur    as a child, and currently  . Hypercholesteremia   . Hypertension   . Hypothyroidism   . Pneumonia   . Sleep apnea   . Thyroid disease     Patient Active Problem List   Diagnosis Date Noted  . Posterior vitreous detachment of both eyes 04/12/2016  . Depression 11/03/2015  . Resting tremor 11/03/2015  . Essential tremor 05/25/2015  . Astigmatism 03/19/2014  . DOE (dyspnea on exertion) 10/08/2013  . Palpitations 10/08/2013  . Basal cell carcinoma of left temple region 02/26/2013  . Venous insufficiency 09/02/2012  . Nuclear sclerotic cataract of both eyes 07/16/2012  . Chronic foot pain 12/12/2011  . Obesity (BMI 30.0-34.9) 12/12/2011  . Chronic anxiety 08/14/2011  . Chronic back pain 08/14/2011  . Dyslipidemia, goal LDL below 100 08/14/2011  . Essential hypertension with goal blood pressure less than 140/90 08/14/2011  . Hypothyroidism 08/14/2011  . Mild mitral insufficiency 08/14/2011  .  Narcolepsy 08/14/2011  . Prediabetes 08/14/2011  . Vitamin D deficiency 08/14/2011    Past Surgical History:  Procedure Laterality Date  . KNEE ARTHROSCOPY Right 03/29/2018   Procedure: ARTHROSCOPY KNEE, PARTIAL MEDIAL MENISECTOMY, MEDIAL CHONDROPLASTY;  Surgeon: Dereck Leep, MD;  Location: [facility] ORS;  Service: Orthopedics;  Laterality: Right;  . SHOULDER SURGERY    . thumb surgery      Prior to Admission medications   Medication Sig Start Date End Date Taking? Authorizing Provider  b complex vitamins tablet Take 1 tablet by mouth daily with lunch.    [provider]  bisoprolol-hydrochlorothiazide (ZIAC) 5-6.25 MG tablet Take 1 tablet by mouth daily. 03/04/18   [provider]  cholecalciferol (VITAMIN D) 1000 units tablet Take 1,000 Units by mouth daily with lunch.    [provider]  Cholecalciferol (VITAMIN D3) 5000 units CAPS Take by mouth daily.    [provider]  Coenzyme Q10 (VITALINE COQ10) 300 MG WAFR Take 300 mg by mouth daily with lunch.    [provider]  Cyanocobalamin (VITAMIN B-12) 5000 MCG SUBL Place under the tongue daily.    [provider]  FLUoxetine (PROZAC) 20 MG capsule Take 20 mg by mouth daily.    [provider]  Glucosamine Sulfate (DONA PO) Take 2 tablets by mouth daily with lunch.    [provider]  HYDROcodone-acetaminophen (NORCO) 5-325 MG tablet Take 1-2 tablets by mouth every 4 (four) hours as needed  for moderate pain. 03/29/18   Hooten, Laurice Record, MD  levothyroxine (SYNTHROID, LEVOTHROID) 100 MCG tablet Take 100 mcg by mouth daily before breakfast. 01/05/18   [provider]  modafinil (PROVIGIL) 200 MG tablet Take 100 mg by mouth daily.  01/02/18   [provider]  Multiple Vitamins-Minerals (HAIR VITAMINS PO) Take 2 tablets by mouth daily with lunch.     [provider]  Omega-3 Fatty Acids (FISH OIL ULTRA) 1400 MG CAPS Take 4,200 mg by mouth daily at 2 PM.     [provider]  OVER THE COUNTER MEDICATION Take 2 capsules by mouth daily with lunch. Zyflamend Whole Body    [provider]  Pitavastatin Calcium (LIVALO) 4 MG TABS Take 4 mg by mouth daily. Patient will hold for elevated lft labwork    [provider]    Allergies Niacin and related, Ezetimibe, and Statins  No family history on file.  Social History Social History   Tobacco Use  . Smoking status: Never Smoker  . Smokeless tobacco: Never Used  Substance Use Topics  . Alcohol use: No  . Drug use: No    Review of Systems Constitutional: No fever/chills Eyes: No visual changes. ENT: No sore throat. Cardiovascular: Denies chest pain. Respiratory: Denies shortness of breath. Positive for cough. Gastrointestinal: Positive for epigastric pain. Positive for constipation.  Genitourinary: Negative for dysuria. Musculoskeletal: Negative for back pain. Skin: Negative for rash. Neurological: Negative for headaches, focal weakness or numbness.  ____________________________________________   PHYSICAL EXAM:  VITAL SIGNS: ED Triage Vitals  Enc Vitals Group     BP 03/31/19 0721 (!) 115/91     Pulse Rate 03/31/19 0721 95     Resp 03/31/19 0721 17     Temp 03/31/19 0721 97.9 F (36.6 C)     Temp Source 03/31/19 0721 Oral     SpO2 03/31/19 0721 92 %     Weight 03/31/19 0722 190 lb (86.2 kg)     Height 03/31/19 0722 5\' 3"  (1.6 m)     Head Circumference --      Peak Flow --      Pain Score 03/31/19 0722 8   Constitutional: Alert and oriented.  Eyes: Conjunctivae are normal.  ENT      Head: Normocephalic and atraumatic.      Nose: No congestion/rhinnorhea.      Mouth/Throat: Mucous membranes are moist.      Neck: No stridor. Hematological/Lymphatic/Immunilogical: No cervical lymphadenopathy. Cardiovascular: Normal rate, regular rhythm.  No murmurs, rubs, or gallops.  Respiratory: Normal respiratory effort without tachypnea nor retractions. Breath  sounds are clear and equal bilaterally. No wheezes/rales/rhonchi. Gastrointestinal: Soft and tender in the epigastric region. No rebound. No guarding.  Genitourinary: Deferred Musculoskeletal: Normal range of motion in all extremities. No lower extremity edema. Neurologic:  Normal speech and language. No gross focal neurologic deficits are appreciated.  Skin:  Skin is warm, dry and intact. No rash noted. Psychiatric: Mood and affect are normal. Speech and behavior are normal. Patient exhibits appropriate insight and judgment.  ____________________________________________    LABS (pertinent positives/negatives)  CBC wbc 10.8, hgb 13.4, plt 187 CMP na 133, k 3.4, glu 169, cr 0.89 Lipase 50 ____________________________________________   EKG  None  ____________________________________________    RADIOLOGY  RUQ [place] Normal gallbladder ____________________________________________   PROCEDURES  Procedures  ____________________________________________   INITIAL IMPRESSION / ASSESSMENT AND PLAN / ED COURSE  Pertinent labs & imaging results that were available during my care of  the patient were reviewed by me and considered in my medical decision making (see chart for details).   Patient presented to the emergency department today because of concerns for epigastric pain.  Patient did have slight elevation of AST and ALT very mild leukocytosis.  She was somewhat tender in the epigastric region. Patient did feel better after pain medication. She did have US performed which did not show any gallstones. I did discuss with patient about performing CT scan to further evaluate. The patient however stated that she felt comfortable deferring ct scan at this time. I think this is reasonable. We did discuss return precautions.    ____________________________________________   FINAL CLINICAL IMPRESSION(S) / ED DIAGNOSES  Final diagnoses:  Epigastric pain     Note: This dictation was  prepared with Dragon dictation. Any transcriptional errors that result from this process are unintentional     Nance Pear, MD 03/31/19 1328

## 2019-03-31 NOTE — ED Triage Notes (Signed)
Pt c/o epigastric pain that radiates into the back since Thursday, states she has not had a BM in about 4-5 days.

## 2019-03-31 NOTE — Discharge Instructions (Addendum)
Please seek medical attention for any high fevers, chest pain, shortness of breath, change in behavior, persistent vomiting, bloody stool or any other new or concerning symptoms.  

## 2019-03-31 NOTE — ED Notes (Signed)
Pt states she urinated prior to arriving to ed. Pt unable to provide sample at this time

## 2019-04-21 ENCOUNTER — Other Ambulatory Visit: Payer: Self-pay

## 2019-04-21 ENCOUNTER — Encounter: Payer: Self-pay | Admitting: Family Medicine

## 2019-04-21 ENCOUNTER — Ambulatory Visit (INDEPENDENT_AMBULATORY_CARE_PROVIDER_SITE_OTHER): Payer: Medicare Other | Admitting: Family Medicine

## 2019-04-21 VITALS — BP 195/69 | HR 65 | Temp 98.1°F | Ht 63.0 in | Wt 191.0 lb

## 2019-04-21 DIAGNOSIS — F411 Generalized anxiety disorder: Secondary | ICD-10-CM | POA: Diagnosis not present

## 2019-04-21 DIAGNOSIS — I10 Essential (primary) hypertension: Secondary | ICD-10-CM | POA: Diagnosis not present

## 2019-04-21 DIAGNOSIS — E039 Hypothyroidism, unspecified: Secondary | ICD-10-CM

## 2019-04-21 DIAGNOSIS — E1159 Type 2 diabetes mellitus with other circulatory complications: Secondary | ICD-10-CM

## 2019-04-21 DIAGNOSIS — E785 Hyperlipidemia, unspecified: Secondary | ICD-10-CM

## 2019-04-21 DIAGNOSIS — G47419 Narcolepsy without cataplexy: Secondary | ICD-10-CM

## 2019-04-21 DIAGNOSIS — K2970 Gastritis, unspecified, without bleeding: Secondary | ICD-10-CM | POA: Diagnosis not present

## 2019-04-21 MED ORDER — BISOPROLOL-HYDROCHLOROTHIAZIDE 5-6.25 MG PO TABS: 1.0000 | ORAL_TABLET | Freq: Every day | ORAL | 3 refills | Status: DC

## 2019-04-21 MED ORDER — MODAFINIL 200 MG PO TABS: 100.0000 mg | ORAL_TABLET | Freq: Every day | ORAL | 2 refills | Status: DC

## 2019-04-21 MED ORDER — LEVOTHYROXINE SODIUM 100 MCG PO TABS: 100.0000 ug | ORAL_TABLET | Freq: Every day | ORAL | 2 refills | Status: DC

## 2019-04-21 NOTE — Progress Notes (Signed)
Patient: Wendy Beck Female    DOB: 12-16-1945   73 y.o.   MRN: [id number] Visit Date: 04/21/2019  Today's Provider: Wilhemena Durie, MD   Chief Complaint  Patient presents with  . Establish Care   Subjective:     HPI   Pt coming in today to establish care. She has a litany of issues,none of which are causing her problems now. She sees Neurology for narcolepsy.  She sees cardiology,hematology,and GI. In reviewing the medical info I have today also she is diabetic as her last A1C was 7.0. She does not appear to be on any DM meds. She is "watchhng my food." She says metformin--"made me dizzy." Allergies  Allergen Reactions  . Niacin And Related Hives    flushing  . Ezetimibe Other (See Comments)    Increased lft    . Statins Other (See Comments)    Muscle aches Increased lft      Current Outpatient Medications:  .  b complex vitamins tablet, Take 1 tablet by mouth daily with lunch., Disp: , Rfl:  .  bisoprolol-hydrochlorothiazide (ZIAC) 5-6.25 MG tablet, Take 1 tablet by mouth daily., Disp: , Rfl: 3 .  cholecalciferol (VITAMIN D) 1000 units tablet, Take 1,000 Units by mouth daily with lunch., Disp: , Rfl:  .  Cholecalciferol (VITAMIN D3) 5000 units CAPS, Take by mouth daily., Disp: , Rfl:  .  Cyanocobalamin (VITAMIN B-12) 5000 MCG SUBL, Place under the tongue daily., Disp: , Rfl:  .  famotidine (PEPCID) 20 MG tablet, Take 1 tablet (20 mg total) by mouth daily., Disp: 30 tablet, Rfl: 1 .  FLUoxetine (PROZAC) 20 MG capsule, Take 20 mg by mouth daily., Disp: , Rfl:  .  Glucosamine Sulfate (DONA PO), Take 2 tablets by mouth daily with lunch., Disp: , Rfl:  .  levothyroxine (SYNTHROID, LEVOTHROID) 100 MCG tablet, Take 100 mcg by mouth daily before breakfast., Disp: , Rfl: 2 .  modafinil (PROVIGIL) 200 MG tablet, Take 100 mg by mouth daily. , Disp: , Rfl: 2 .  Multiple Vitamins-Minerals (HAIR VITAMINS PO), Take 2 tablets by mouth daily with lunch. , Disp: , Rfl:  .   Omega-3 Fatty Acids (FISH OIL ULTRA) 1400 MG CAPS, Take 4,200 mg by mouth daily at 2 PM., Disp: , Rfl:  .  OVER THE COUNTER MEDICATION, Take 2 capsules by mouth daily with lunch. Zyflamend Whole Body, Disp: , Rfl:  .  Coenzyme Q10 (VITALINE COQ10) 300 MG WAFR, Take 300 mg by mouth daily with lunch., Disp: , Rfl:  .  HYDROcodone-acetaminophen (NORCO) 5-325 MG tablet, Take 1-2 tablets by mouth every 4 (four) hours as needed for moderate pain. (Patient not taking: Reported on 04/21/2019), Disp: 20 tablet, Rfl: 0 .  Pitavastatin Calcium (LIVALO) 4 MG TABS, Take 4 mg by mouth daily. Patient will hold for elevated lft labwork, Disp: , Rfl:  .  sucralfate (CARAFATE) 1 g tablet, Take 1 tablet (1 g total) by mouth 4 (four) times daily. (Patient not taking: Reported on 04/21/2019), Disp: 60 tablet, Rfl: 0  Review of Systems  Constitutional: Positive for fatigue. Negative for activity change, appetite change, chills, diaphoresis, fever and unexpected weight change.  HENT: Positive for congestion, dental problem and postnasal drip. Negative for drooling, ear discharge, ear pain, facial swelling, hearing loss, mouth sores, nosebleeds, rhinorrhea, sinus pressure, sinus pain, sneezing, sore throat, tinnitus, trouble swallowing and voice change.   Eyes: Negative.   Respiratory: Negative.   Cardiovascular: Positive  for palpitations. Negative for chest pain and leg swelling.  Gastrointestinal: Negative.   Endocrine: Positive for heat intolerance. Negative for cold intolerance, polydipsia, polyphagia and polyuria.  Genitourinary: Negative.   Musculoskeletal: Positive for arthralgias, back pain, gait problem, myalgias and neck pain. Negative for joint swelling and neck stiffness.  Skin: Negative.   Allergic/Immunologic: Positive for environmental allergies. Negative for food allergies and immunocompromised state.  Neurological: Negative for dizziness, tremors, seizures, syncope, facial asymmetry, speech difficulty,  weakness, light-headedness, numbness and headaches.  Hematological: Negative.   Psychiatric/Behavioral: Negative.     Social History   Tobacco Use  . Smoking status: Never Smoker  . Smokeless tobacco: Never Used  Substance Use Topics  . Alcohol use: No      Objective:   BP (!) 195/69 (BP Location: Right Arm, Patient Position: Sitting, Cuff Size: Normal)   Pulse 65   Temp 98.1 F (36.7 C) (Oral)   Ht 5\' 3"  (1.6 m)   Wt 191 lb (86.6 kg)   BMI 33.83 kg/m  Vitals:   04/21/19 1046  BP: (!) 195/69  Pulse: 65  Temp: 98.1 F (36.7 C)  TempSrc: Oral  Weight: 191 lb (86.6 kg)  Height: 5\' 3"  (1.6 m)     Physical Exam Vitals signs reviewed.  Constitutional:      Appearance: She is obese.  HENT:     Head: Normocephalic and atraumatic.     Right Ear: External ear normal.     Left Ear: External ear normal.  Eyes:     General: Scleral icterus present.     Conjunctiva/sclera: Conjunctivae normal.  Neck:     Vascular: No carotid bruit.  Cardiovascular:     Rate and Rhythm: Normal rate and regular rhythm.     Heart sounds: Normal heart sounds.  Pulmonary:     Effort: Pulmonary effort is normal.     Breath sounds: Normal breath sounds.  Abdominal:     Palpations: Abdomen is soft.  Lymphadenopathy:     Cervical: No cervical adenopathy.  Skin:    General: Skin is warm and dry.  Neurological:     General: No focal deficit present.     Mental Status: She is alert and oriented to person, place, and time.  Psychiatric:        Mood and Affect: Mood normal.        Behavior: Behavior normal.        Thought Content: Thought content normal.      No results found for any visits on 04/21/19.     Assessment & Plan    1. Hypothyroidism, unspecified type  - levothyroxine (SYNTHROID) 100 MCG tablet; Take 1 tablet (100 mcg total) by mouth daily before breakfast.  Dispense: 90 tablet; Refill: 2  2. Essential hypertension with goal blood pressure less than 140/90  -  bisoprolol-hydrochlorothiazide (ZIAC) 5-6.25 MG tablet; Take 1 tablet by mouth daily.  Dispense: 90 tablet; Refill: 3  3. Type 2 diabetes mellitus with other circulCatory complication, without [place]-term current use of insulin (HCC) "intolerant " of metformin. RTC 2 months for A1  4. Hyperlipidemia, unspecified hyperlipidemia type On [place]. More than 50% 25 minute visit spent in counseling or coordination of care  5. Primary narcolepsy without cataplexy On Modafanil.  6. Gastritis without bleeding, unspecified chronicity, unspecified gastritis type Sees GI tomorrow.  7. GAD (generalized anxiety disorder) Per above list and pt affect today.     Nafisa Olds Cranford Mon, MD  [facility]

## 2019-04-22 DIAGNOSIS — K219 Gastro-esophageal reflux disease without esophagitis: Secondary | ICD-10-CM | POA: Insufficient documentation

## 2019-04-27 DIAGNOSIS — R748 Abnormal levels of other serum enzymes: Secondary | ICD-10-CM | POA: Insufficient documentation

## 2019-06-13 IMAGING — MR MR KNEE*R* W/O CM
6 series · 37 of 40 positions shown · non-contrast
Comparison: None.

CLINICAL DATA: Right knee pain since a fall down stairs in
November 2017. Limited range of motion.

EXAM:
MRI OF THE RIGHT KNEE WITHOUT CONTRAST
TECHNIQUE: Multiplanar, multisequence MR imaging of the knee was performed. No
intravenous contrast was administered.

[Series 3: PD fat-sat · axial · 3.0mm · 0.50mm/px · z∈[-59,+80]mm · 8 of 43 slices shown (1 of 4)]
[im 1/43]
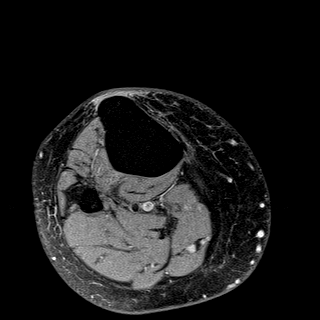
[im 7/43]
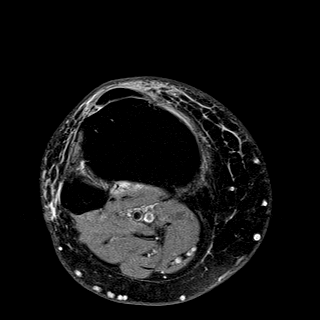
[im 13/43]
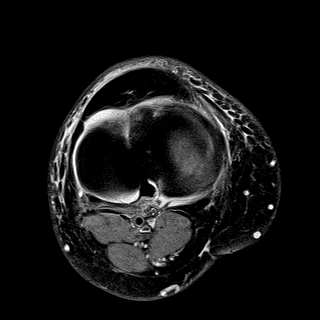
[im 19/43]
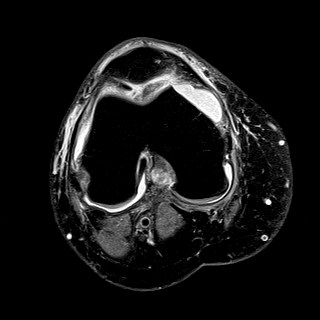
[im 25/43]
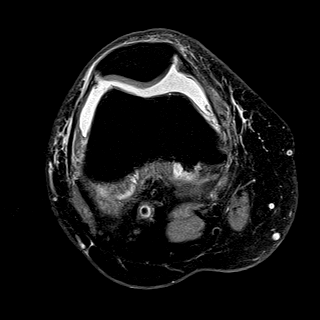
[im 31/43]
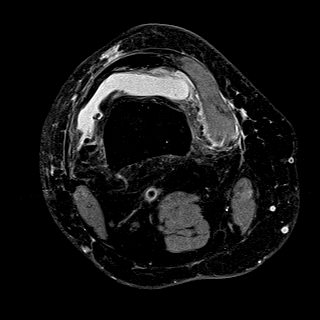
[im 37/43]
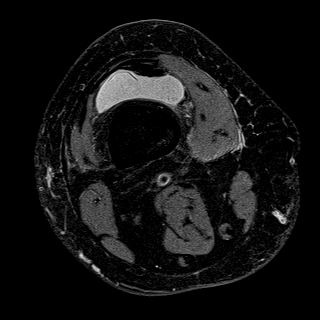
[im 43/43]
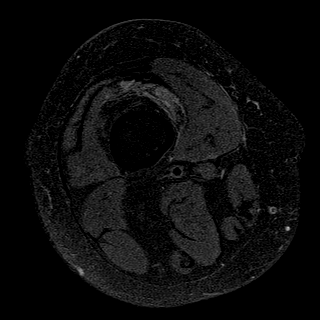

[Series 4: T1 · coronal · 3.0mm · 0.50mm/px · 4 of 32 slices shown]
[im 1/32]
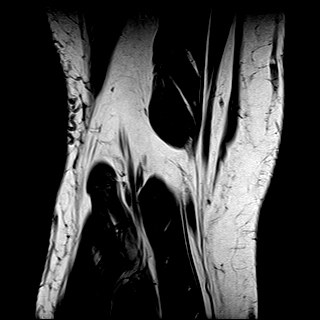
[im 6/32]
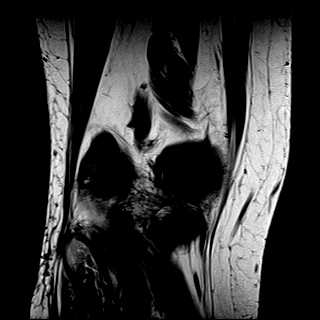
[im 11/32]
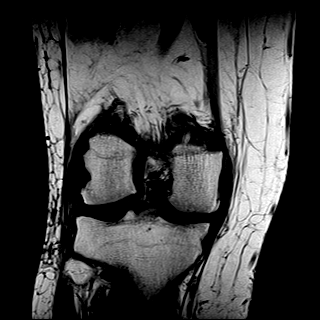
[im 16/32]
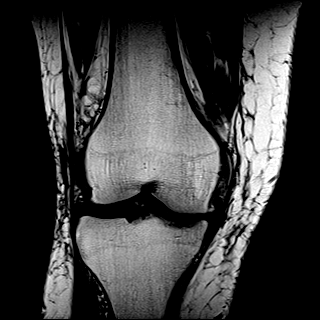

[Series 5: T2 fat-sat · coronal · 3.0mm · 0.31mm/px · 7 of 32 slices shown]
[im 1/32]
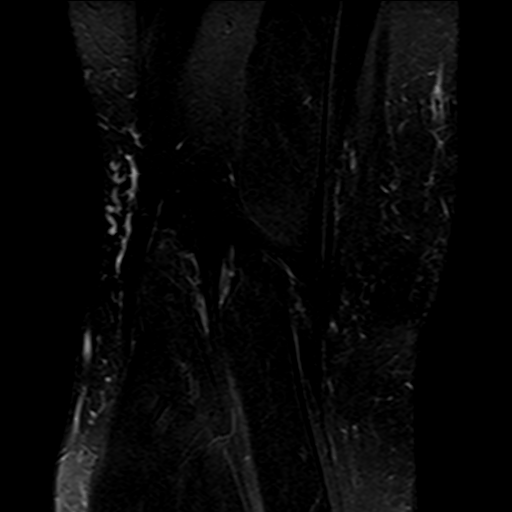
[im 6/32]
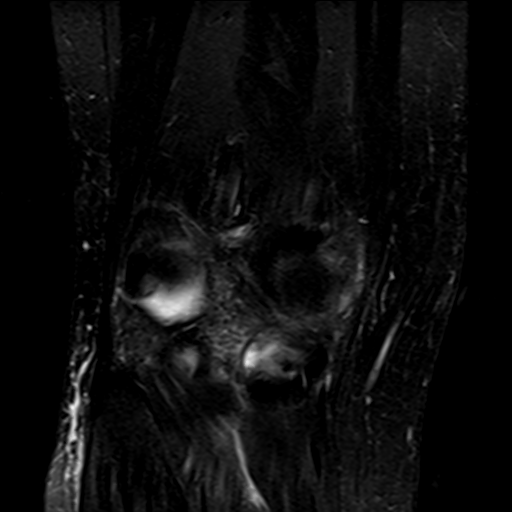
[im 11/32]
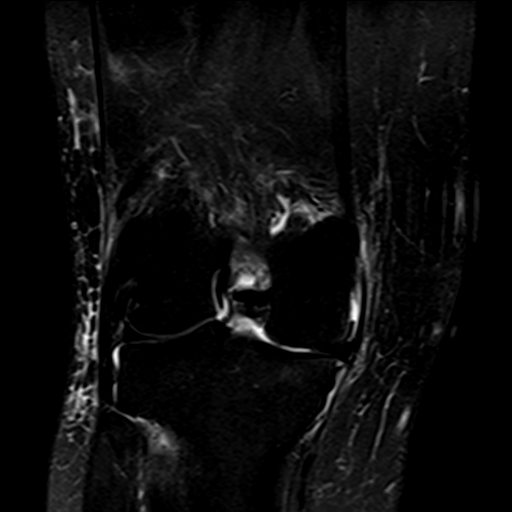
[im 16/32]
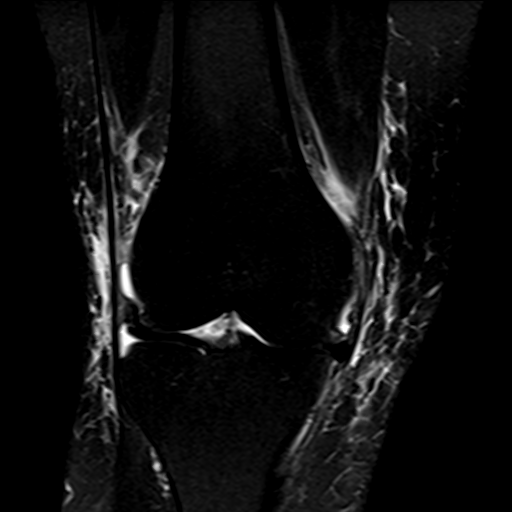
[im 21/32]
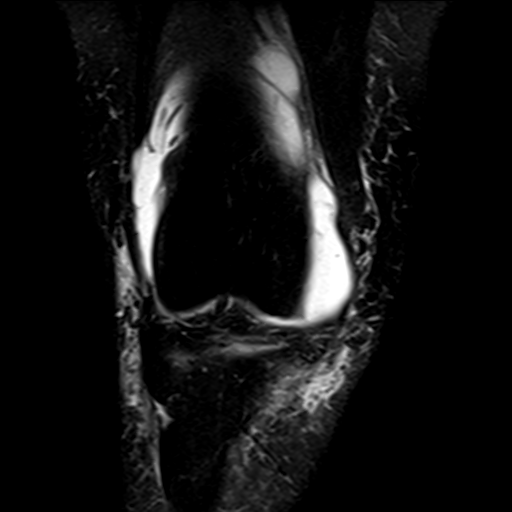
[im 26/32]
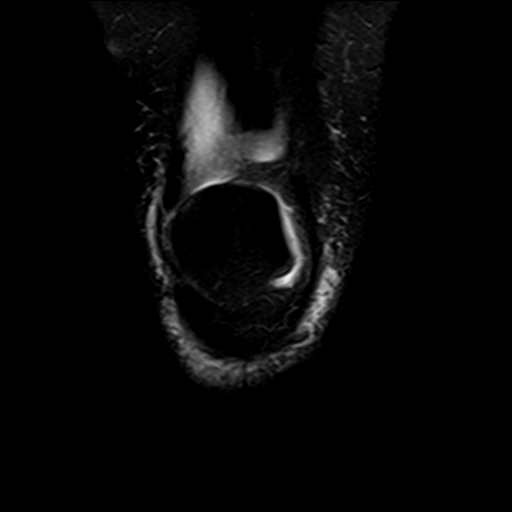
[im 32/32]
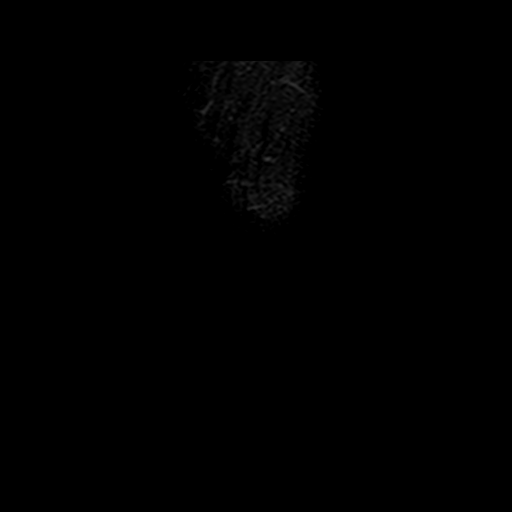

[Series 6: PD fat-sat · coronal · 3.0mm · 0.50mm/px · 7 of 32 slices shown (2 of 4)]
[im 1/32]
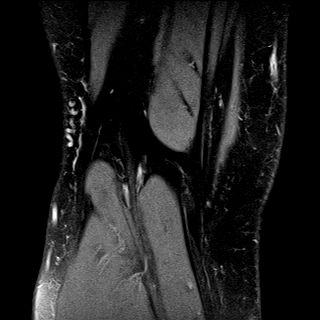
[im 6/32]
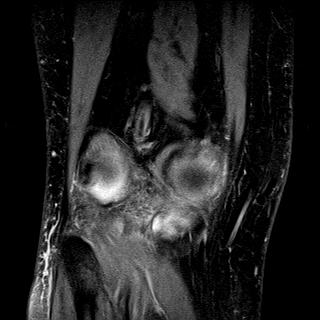
[im 11/32]
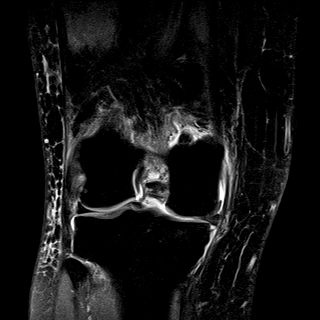
[im 16/32]
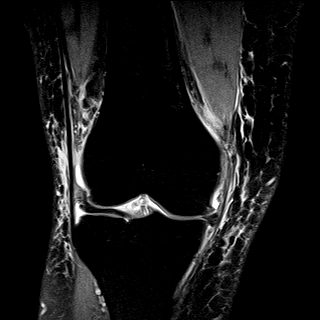
[im 21/32]
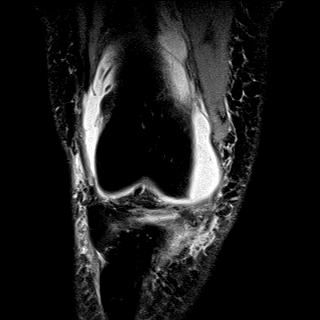
[im 26/32]
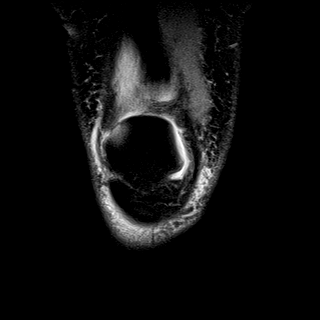
[im 32/32]
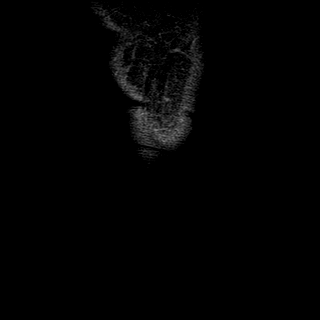

[Series 7: PD fat-sat · sagittal · 3.0mm · 0.50mm/px · 7 of 33 slices shown (3 of 4)]
[im 1/33]
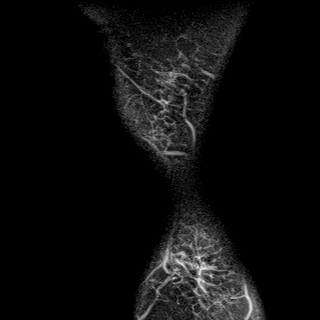
[im 6/33]
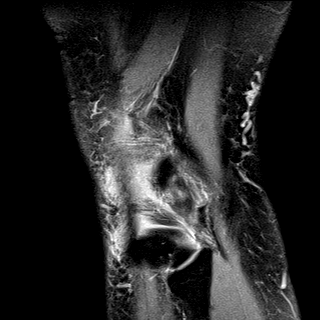
[im 11/33]
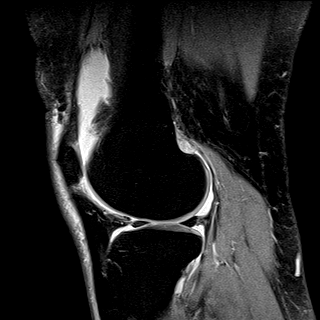
[im 17/33]
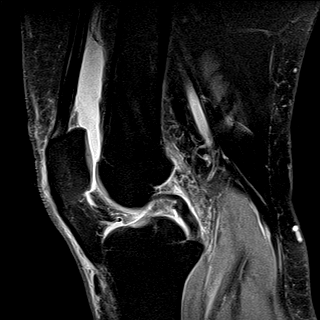
[im 22/33]
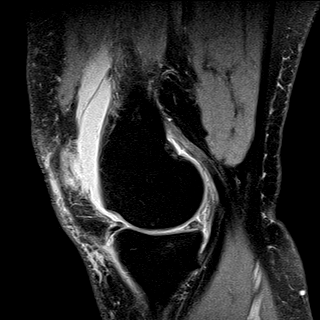
[im 27/33]
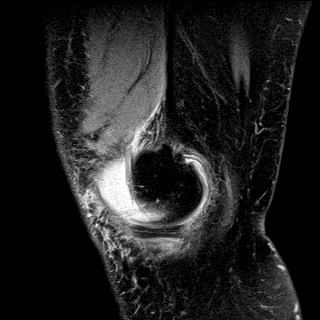
[im 33/33]
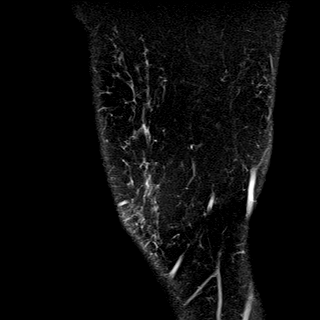

[Series 8: PD fat-sat · oblique · 2.0mm · 0.62mm/px · 4 of 17 slices shown (4 of 4)]
[im 1/17]
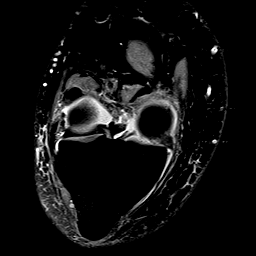
[im 6/17]
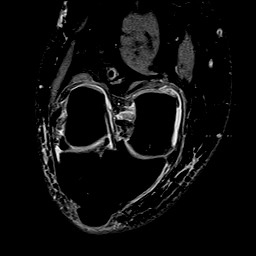
[im 11/17]
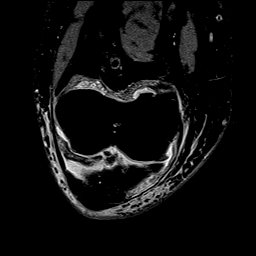
[im 17/17]
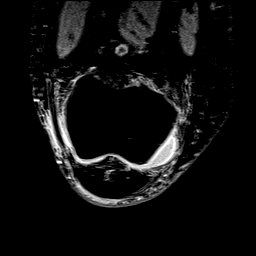

[37 of 40 positions shown; findings below may reference images not displayed]

FINDINGS: MENISCI

Medial meniscus: There is a complete radial tear just peripheral to
the root of the posterior horn of the medial meniscus. Gap between
fragments is approximately 5 mm. More peripherally in the posterior
horn, a horizontal tear reaches the femoral articular surface.
Fraying along the free edge of the body is identified. The body is
extruded peripherally.

Lateral meniscus:  Intact.

LIGAMENTS

Cruciates:  Intact.

Collaterals:  Intact.

CARTILAGE

Patellofemoral:  Preserved.

Medial:  Thinned without focal defect.

Lateral:  Preserved.

Joint:  Moderate joint effusion.

Popliteal Fossa:  No Baker's cyst.

Extensor Mechanism:  Intact.

Bones: No fracture or worrisome lesion. Small osteophytes are seen
about the medial and lateral compartments, more prominent on the
medial side. Minimal subchondral edema in the medial compartment is
also identified.

Other: None.
IMPRESSION: Large radial tear peripheral to the root of the posterior horn of
the medial meniscus. Horizontal tear more peripherally in the
posterior horn reaches the femoral articular surface.

Moderate medial compartment osteoarthritis. Little to no
degenerative change is seen in the lateral and patellofemoral
compartments.

## 2019-06-23 ENCOUNTER — Other Ambulatory Visit: Payer: Self-pay

## 2019-06-23 ENCOUNTER — Ambulatory Visit (INDEPENDENT_AMBULATORY_CARE_PROVIDER_SITE_OTHER): Payer: Medicare Other | Admitting: Family Medicine

## 2019-06-23 ENCOUNTER — Encounter: Payer: Self-pay | Admitting: Family Medicine

## 2019-06-23 VITALS — BP 147/79 | HR 70 | Temp 96.8°F | Resp 98 | Wt 192.4 lb

## 2019-06-23 DIAGNOSIS — E1159 Type 2 diabetes mellitus with other circulatory complications: Secondary | ICD-10-CM

## 2019-06-23 DIAGNOSIS — E559 Vitamin D deficiency, unspecified: Secondary | ICD-10-CM

## 2019-06-23 DIAGNOSIS — R937 Abnormal findings on diagnostic imaging of other parts of musculoskeletal system: Secondary | ICD-10-CM | POA: Diagnosis not present

## 2019-06-23 DIAGNOSIS — G47419 Narcolepsy without cataplexy: Secondary | ICD-10-CM | POA: Diagnosis not present

## 2019-06-23 DIAGNOSIS — E039 Hypothyroidism, unspecified: Secondary | ICD-10-CM | POA: Diagnosis not present

## 2019-06-23 DIAGNOSIS — I10 Essential (primary) hypertension: Secondary | ICD-10-CM

## 2019-06-23 DIAGNOSIS — Z780 Asymptomatic menopausal state: Secondary | ICD-10-CM | POA: Diagnosis not present

## 2019-06-23 DIAGNOSIS — Z9189 Other specified personal risk factors, not elsewhere classified: Secondary | ICD-10-CM

## 2019-06-23 LAB — POCT GLYCOSYLATED HEMOGLOBIN (HGB A1C): Hemoglobin A1C: 7.3 % — AB (ref 4.0–5.6)

## 2019-06-23 MED ORDER — MODAFINIL 200 MG PO TABS: 100.0000 mg | ORAL_TABLET | Freq: Every day | ORAL | 1 refills | Status: DC

## 2019-06-23 MED ORDER — LOSARTAN POTASSIUM 50 MG PO TABS: 100.0000 mg | ORAL_TABLET | Freq: Every day | ORAL | 1 refills | Status: DC

## 2019-06-23 NOTE — Progress Notes (Signed)
Patient: Wendy Beck Female    DOB: 1945/11/01   73 y.o.   MRN: [id number] Visit Date: 06/23/2019  Today's Provider: Wilhemena Durie, MD   Chief Complaint  Patient presents with  . Follow-up  . Hypothyroidism   Subjective:     HPI 2 Month follow up for hypothyroidism. Overall she says she is feeling fairly well.  For some reason she has not been taking her Provigil for narcolepsy and  requests a refill on this. She states she needs a bone density and also would like to have ultrasounds of her legs done for venous insufficiency.  No real leg pain.  No claudication.  No extensive swelling. Allergies  Allergen Reactions  . Niacin And Related Hives    flushing  . Ezetimibe Other (See Comments)    Increased lft    . Statins Other (See Comments)    Muscle aches Increased lft      Current Outpatient Medications:  .  amoxicillin (AMOXIL) 500 MG capsule, Take 500 mg by mouth 2 (two) times daily., Disp: , Rfl:  .  b complex vitamins tablet, Take 1 tablet by mouth daily with lunch., Disp: , Rfl:  .  bisoprolol-hydrochlorothiazide (ZIAC) 5-6.25 MG tablet, Take 1 tablet by mouth daily., Disp: 90 tablet, Rfl: 3 .  cholecalciferol (VITAMIN D) 1000 units tablet, Take 1,000 Units by mouth daily with lunch., Disp: , Rfl:  .  Cholecalciferol (VITAMIN D3) 5000 units CAPS, Take by mouth daily., Disp: , Rfl:  .  clarithromycin (BIAXIN) 500 MG tablet, Take 500 mg by mouth 2 (two) times daily., Disp: , Rfl:  .  Coenzyme Q10 (VITALINE COQ10) 300 MG WAFR, Take 300 mg by mouth daily with lunch., Disp: , Rfl:  .  Cyanocobalamin (VITAMIN B-12) 5000 MCG SUBL, Place under the tongue daily., Disp: , Rfl:  .  famotidine (PEPCID) 20 MG tablet, Take 1 tablet (20 mg total) by mouth daily., Disp: 30 tablet, Rfl: 1 .  FLUoxetine (PROZAC) 20 MG capsule, Take 20 mg by mouth daily., Disp: , Rfl:  .  Glucosamine Sulfate (DONA PO), Take 2 tablets by mouth daily with lunch., Disp: , Rfl:  .   levothyroxine (SYNTHROID) 100 MCG tablet, Take 1 tablet (100 mcg total) by mouth daily before breakfast., Disp: 90 tablet, Rfl: 2 .  modafinil (PROVIGIL) 200 MG tablet, Take 0.5 tablets (100 mg total) by mouth daily., Disp: 90 tablet, Rfl: 1 .  Multiple Vitamins-Minerals (HAIR VITAMINS PO), Take 2 tablets by mouth daily with lunch. , Disp: , Rfl:  .  Omega-3 Fatty Acids (FISH OIL ULTRA) 1400 MG CAPS, Take 4,200 mg by mouth daily at 2 PM., Disp: , Rfl:  .  omeprazole (PRILOSEC) 20 MG capsule, Take 20 mg by mouth daily., Disp: , Rfl:  .  OVER THE COUNTER MEDICATION, Take 2 capsules by mouth daily with lunch. Zyflamend Whole Body, Disp: , Rfl:  .  Pitavastatin Calcium (LIVALO) 4 MG TABS, Take 4 mg by mouth daily. Patient will hold for elevated lft labwork, Disp: , Rfl:  .  HYDROcodone-acetaminophen (NORCO) 5-325 MG tablet, Take 1-2 tablets by mouth every 4 (four) hours as needed for moderate pain. (Patient not taking: Reported on 04/21/2019), Disp: 20 tablet, Rfl: 0 .  sucralfate (CARAFATE) 1 g tablet, Take 1 tablet (1 g total) by mouth 4 (four) times daily. (Patient not taking: Reported on 04/21/2019), Disp: 60 tablet, Rfl: 0  Review of Systems  Constitutional: Negative.  HENT: Negative.   Eyes: Negative.   Respiratory: Negative.   Cardiovascular: Negative.   Gastrointestinal: Negative.   Endocrine: Negative.   Allergic/Immunologic: Negative.   Neurological:       Daytime sleepiness.  Hematological: Negative.   Psychiatric/Behavioral: Negative.     Social History   Tobacco Use  . Smoking status: Never Smoker  . Smokeless tobacco: Never Used  Substance Use Topics  . Alcohol use: No      Objective:   BP (!) 147/79 (BP Location: Left Arm, Patient Position: Sitting, Cuff Size: Large)   Pulse 70   Temp (!) 96.8 F (36 C) (Temporal)   Resp (!) 98   Wt 192 lb 6.4 oz (87.3 kg)   SpO2 98%   BMI 34.08 kg/m  Vitals:   06/23/19 0813 06/23/19 0903  BP: (!) 161/81 (!) 147/79  Pulse: 70    Resp: (!) 98   Temp: (!) 96.8 F (36 C)   TempSrc: Temporal   SpO2: 98%   Weight: 192 lb 6.4 oz (87.3 kg)   Body mass index is 34.08 kg/m. /Recheck BP is 147/79  Physical Exam Vitals signs reviewed.  Constitutional:      Appearance: She is obese.  HENT:     Head: Normocephalic and atraumatic.     Right Ear: External ear normal.     Left Ear: External ear normal.  Eyes:     General: Scleral icterus present.     Conjunctiva/sclera: Conjunctivae normal.  Neck:     Vascular: No carotid bruit.  Cardiovascular:     Rate and Rhythm: Normal rate and regular rhythm.     Heart sounds: Normal heart sounds.  Pulmonary:     Effort: Pulmonary effort is normal.     Breath sounds: Normal breath sounds.  Abdominal:     Palpations: Abdomen is soft.  Lymphadenopathy:     Cervical: No cervical adenopathy.  Skin:    General: Skin is warm and dry.  Neurological:     General: No focal deficit present.     Mental Status: She is alert and oriented to person, place, and time.  Psychiatric:        Mood and Affect: Mood normal.        Behavior: Behavior normal.        Thought Content: Thought content normal.      No results found for any visits on 06/23/19.     Assessment & Plan    1. Type 2 diabetes mellitus with other circulatory complication, without [place]-term current use of insulin ([place]) Work on lifestyle. - POCT HgB A1C--7.3 today  2. Primary narcolepsy without cataplexy Refill modafinil 200 mg daily  3. At risk for loss of bone density   4. Abnormal bone density screening  - DG Bone Density; Future  5. Hypothyroidism, unspecified type  - DG Bone Density; Future  6. Vitamin D deficiency  - DG Bone Density; Future  7. Post-menopausal  - DG Bone Density; Future 8.HTN Add losartan 50 mg daily.  Return to clinic 1 to 2 months    Wilhemena Durie, MD  [facility]

## 2019-06-24 ENCOUNTER — Telehealth: Payer: Self-pay | Admitting: Family Medicine

## 2019-06-24 NOTE — Telephone Encounter (Signed)
RX sent to [facility] yesterday for 90 day supply. Patient advised.

## 2019-06-24 NOTE — Telephone Encounter (Signed)
Pt needing the 90 tablets for her modafinil (PROVIGIL) 200 MG tablet.  Pt doesn't want the 30 tablets they have now.  Call into:  [facility] # [id number] [place] [phone] (Phone) [phone] (Fax)    Thanks, [employer]

## 2019-07-23 ENCOUNTER — Ambulatory Visit: Payer: Medicare Other

## 2019-07-24 ENCOUNTER — Ambulatory Visit
Admission: RE | Admit: 2019-07-24 | Discharge: 2019-07-24 | Disposition: A | Discharge status: Home / Self Care | Payer: Medicare Other | Source: Ambulatory Visit | Attending: Family Medicine | Admitting: Family Medicine

## 2019-07-24 DIAGNOSIS — R937 Abnormal findings on diagnostic imaging of other parts of musculoskeletal system: Secondary | ICD-10-CM | POA: Insufficient documentation

## 2019-07-24 DIAGNOSIS — Z780 Asymptomatic menopausal state: Secondary | ICD-10-CM | POA: Insufficient documentation

## 2019-07-24 DIAGNOSIS — E039 Hypothyroidism, unspecified: Secondary | ICD-10-CM | POA: Insufficient documentation

## 2019-07-24 DIAGNOSIS — E559 Vitamin D deficiency, unspecified: Secondary | ICD-10-CM | POA: Insufficient documentation

## 2019-08-05 ENCOUNTER — Other Ambulatory Visit: Payer: Self-pay

## 2019-08-05 ENCOUNTER — Ambulatory Visit (INDEPENDENT_AMBULATORY_CARE_PROVIDER_SITE_OTHER): Payer: Medicare Other

## 2019-08-05 DIAGNOSIS — Z23 Encounter for immunization: Secondary | ICD-10-CM

## 2019-08-14 ENCOUNTER — Other Ambulatory Visit: Payer: Self-pay | Admitting: Family Medicine

## 2019-08-14 DIAGNOSIS — I10 Essential (primary) hypertension: Secondary | ICD-10-CM

## 2019-08-14 MED ORDER — LOSARTAN POTASSIUM 50 MG PO TABS: 100.0000 mg | ORAL_TABLET | Freq: Every day | ORAL | 1 refills | Status: DC

## 2019-08-14 NOTE — Telephone Encounter (Signed)
Pt called back.  Send Rx to [facility] in [place].  Thanks, [employer]

## 2019-08-14 NOTE — Addendum Note (Signed)
Addended by: Julieta Bellini on: 08/14/2019 05:09 PM   Modules accepted: Orders

## 2019-08-14 NOTE — Telephone Encounter (Signed)
°  Needing to discuss:  losartan (COZAAR) 50 MG tablet  Now taking 2 a day.  Needing 180 3 month supply.  Her 2 month appt needing to be changed to a 3 month due to insurance.   Please call pt back at [phone] to discuss.  Thanks, [employer]

## 2019-08-14 NOTE — Telephone Encounter (Signed)
LMOVM for pt to return call. Need to confirm which pharmacy to send rx below?

## 2019-08-22 DIAGNOSIS — Z148 Genetic carrier of other disease: Secondary | ICD-10-CM | POA: Insufficient documentation

## 2019-08-22 DIAGNOSIS — A048 Other specified bacterial intestinal infections: Secondary | ICD-10-CM | POA: Insufficient documentation

## 2019-08-26 ENCOUNTER — Ambulatory Visit: Payer: Medicare Other | Admitting: Family Medicine

## 2019-09-09 ENCOUNTER — Ambulatory Visit: Payer: Medicare Other | Admitting: Family Medicine

## 2019-09-16 ENCOUNTER — Ambulatory Visit: Payer: Medicare Other

## 2019-09-16 ENCOUNTER — Other Ambulatory Visit: Payer: Self-pay

## 2019-09-16 DIAGNOSIS — Z23 Encounter for immunization: Secondary | ICD-10-CM

## 2019-09-16 NOTE — Progress Notes (Signed)
Patient reports fatty liver with elevated LFTs. Twinrix given; tolerated well Aileen Fass, RN

## 2019-10-10 NOTE — Progress Notes (Deleted)
Patient: Wendy Beck Female    DOB: 07/23/1946   74 y.o.   MRN: [id number] Visit Date: 10/10/2019  Today's Provider: Wilhemena Durie, MD   No chief complaint on file.  Subjective:     HPI   Type 2 diabetes mellitus with other circulatory complication, without [place]-term current use of insulin ([place]) From 06/23/2019-Work on lifestyle. HgB A1C--7.3  Primary narcolepsy without cataplexy From 06/23/2019-Refilled modafinil 200 mg daily.  Hypertension From 06/23/2019-Added losartan 50 mg daily.   Allergies  Allergen Reactions  . Niacin And Related Hives    flushing  . Ezetimibe Other (See Comments)    Increased lft    . Statins Other (See Comments)    Muscle aches Increased lft      Current Outpatient Medications:  .  atorvastatin (LIPITOR) 40 MG tablet, Take by mouth., Disp: , Rfl:  .  Azelastine HCl 137 MCG/SPRAY SOLN, one spray by Both Nostrils route daily., Disp: , Rfl:  .  b complex vitamins tablet, Take 1 tablet by mouth daily with lunch., Disp: , Rfl:  .  bisoprolol-hydrochlorothiazide (ZIAC) 5-6.25 MG tablet, Take 1 tablet by mouth daily., Disp: 90 tablet, Rfl: 3 .  cholecalciferol (VITAMIN D) 1000 units tablet, Take 1,000 Units by mouth daily with lunch., Disp: , Rfl:  .  Cholecalciferol (VITAMIN D3) 5000 units CAPS, Take by mouth daily., Disp: , Rfl:  .  clobetasol (TEMOVATE) 0.05 % external solution, Apply topically., Disp: , Rfl:  .  Coenzyme Q10 (VITALINE COQ10) 300 MG WAFR, Take 300 mg by mouth daily with lunch., Disp: , Rfl:  .  Cyanocobalamin (VITAMIN B-12) 5000 MCG SUBL, Place under the tongue daily., Disp: , Rfl:  .  famotidine (PEPCID) 20 MG tablet, Take 1 tablet (20 mg total) by mouth daily., Disp: 30 tablet, Rfl: 1 .  FLUoxetine (PROZAC) 20 MG capsule, Take 20 mg by mouth daily., Disp: , Rfl:  .  Glucosamine Sulfate (DONA PO), Take 2 tablets by mouth daily with lunch., Disp: , Rfl:  .  HYDROcodone-acetaminophen (NORCO) 5-325 MG tablet, Take  1-2 tablets by mouth every 4 (four) hours as needed for moderate pain. (Patient not taking: Reported on 04/21/2019), Disp: 20 tablet, Rfl: 0 .  ketoconazole (NIZORAL) 2 % shampoo, APPLY TO AFFECTED AREA ON SKIN AS DIRECTED, Disp: , Rfl:  .  levothyroxine (SYNTHROID) 100 MCG tablet, Take 1 tablet (100 mcg total) by mouth daily before breakfast., Disp: 90 tablet, Rfl: 2 .  losartan (COZAAR) 50 MG tablet, Take 2 tablets (100 mg total) by mouth daily., Disp: 180 tablet, Rfl: 1 .  metFORMIN (GLUCOPHAGE-XR) 500 MG 24 hr tablet, TAKE 1 TABLET BY MOUTH ONCE DAILY WITH BREAKFAST, Disp: , Rfl:  .  modafinil (PROVIGIL) 200 MG tablet, Take 0.5 tablets (100 mg total) by mouth daily., Disp: 90 tablet, Rfl: 1 .  Multiple Vitamins-Minerals (HAIR VITAMINS PO), Take 2 tablets by mouth daily with lunch. , Disp: , Rfl:  .  Omega-3 Fatty Acids (FISH OIL ULTRA) 1400 MG CAPS, Take 4,200 mg by mouth daily at 2 PM., Disp: , Rfl:  .  omeprazole (PRILOSEC) 20 MG capsule, Take 20 mg by mouth daily., Disp: , Rfl:  .  Pitavastatin Calcium (LIVALO) 4 MG TABS, Take 4 mg by mouth daily. Patient will hold for elevated lft labwork, Disp: , Rfl:  .  potassium chloride (KLOR-CON) 10 MEQ tablet, Take by mouth., Disp: , Rfl:  .  sucralfate (CARAFATE) 1 g tablet, Take  1 tablet (1 g total) by mouth 4 (four) times daily. (Patient not taking: Reported on 04/21/2019), Disp: 60 tablet, Rfl: 0  Review of Systems  Constitutional: Negative for appetite change, chills, fatigue and fever.  Respiratory: Negative for chest tightness and shortness of breath.   Cardiovascular: Negative for chest pain and palpitations.  Gastrointestinal: Negative for abdominal pain, nausea and vomiting.  Neurological: Negative for dizziness and weakness.    Social History   Tobacco Use  . Smoking status: Never Smoker  . Smokeless tobacco: Never Used  Substance Use Topics  . Alcohol use: No      Objective:   There were no vitals taken for this visit. There  were no vitals filed for this visit.There is no height or weight on file to calculate BMI.   Physical Exam   No results found for any visits on 10/13/19.     Assessment & Plan        Wilhemena Durie, MD  [facility]

## 2019-10-13 ENCOUNTER — Ambulatory Visit: Payer: Medicare Other | Admitting: Family Medicine

## 2019-10-15 ENCOUNTER — Other Ambulatory Visit: Payer: Self-pay | Admitting: Family Medicine

## 2019-10-15 DIAGNOSIS — I10 Essential (primary) hypertension: Secondary | ICD-10-CM

## 2019-10-15 MED ORDER — LOSARTAN POTASSIUM 50 MG PO TABS: 100.0000 mg | ORAL_TABLET | Freq: Every day | ORAL | 1 refills | Status: DC

## 2019-10-15 NOTE — Telephone Encounter (Signed)
Medication Refill - Medication: losartan (COZAAR) 50 MG tablet   For 180 tablets    Has the patient contacted their pharmacy? No. (Agent: If no, request that the patient contact the pharmacy for the refill.) (Agent: If yes, when and what did the pharmacy advise?)  Preferred Pharmacy (with phone number or street name):  [place], [place] Phone:  [phone]  Fax:  [phone]       Agent: Please be advised that RX refills may take up to 3 business days. We ask that you follow-up with your pharmacy.

## 2019-10-16 ENCOUNTER — Ambulatory Visit: Payer: Medicare Other

## 2019-10-22 ENCOUNTER — Ambulatory Visit: Payer: Medicare Other | Admitting: Family Medicine

## 2019-10-22 ENCOUNTER — Ambulatory Visit: Payer: Self-pay

## 2019-10-22 NOTE — Telephone Encounter (Signed)
Patient called stating that she need antibiotic.  She states she has nasal drainage and cough.  She insists that this is a chronic condition that she needs antibiotic to get rid of the symptoms. she refuses suggestion that she may need COVID-19 testing. She denies other symptoms.  No fever. She also has questions stating that she is having her hepatitis A-B vaccine and she need to know if it is ok to then do the COVID-19 immunization.  Care advice read to patient.  DT was completed and patient scheduled for virtual appointment in AM  Reason for Disposition . Cough has been present for > 3 weeks  Answer Assessment - Initial Assessment Questions 1. ONSET: "When did the cough begin?"      Started months ago but getting worse 2. SEVERITY: "How bad is the cough today?"      Getting worse 3. RESPIRATORY DISTRESS: "Describe your breathing."      No problems 4. FEVER: "Do you have a fever?" If so, ask: "What is your temperature, how was it measured, and when did it start?"     no 5. SPUTUM: "Describe the color of your sputum" (clear, white, yellow, green)     Unsure nasal drainage is bloody 6. HEMOPTYSIS: "Are you coughing up any blood?" If so ask: "How much?" (flecks, streaks, tablespoons, etc.)    no 7. CARDIAC HISTORY: "Do you have any history of heart disease?" (e.g., heart attack, congestive heart failure)     no 8. LUNG HISTORY: "Do you have any history of lung disease?"  (e.g., pulmonary embolus, asthma, emphysema)     no 9. PE RISK FACTORS: "Do you have a history of blood clots?" (or: recent major surgery, recent prolonged travel, bedridden)   no 10. OTHER SYMPTOMS: "Do you have any other symptoms?" (e.g., runny nose, wheezing, chest pain)      Runny nose 11. PREGNANCY: "Is there any chance you are pregnant?" "When was your last menstrual period?"      N/A 12. TRAVEL: "Have you traveled out of the country in the last month?" (e.g., travel history, exposures)      no  Protocols used:  [place]

## 2019-10-23 ENCOUNTER — Encounter: Payer: Self-pay | Admitting: Family Medicine

## 2019-10-23 ENCOUNTER — Ambulatory Visit (INDEPENDENT_AMBULATORY_CARE_PROVIDER_SITE_OTHER): Payer: Medicare Other | Admitting: Family Medicine

## 2019-10-23 ENCOUNTER — Ambulatory Visit: Payer: Medicare Other

## 2019-10-23 VITALS — Temp 97.8°F

## 2019-10-23 DIAGNOSIS — E039 Hypothyroidism, unspecified: Secondary | ICD-10-CM

## 2019-10-23 DIAGNOSIS — E66811 Obesity, class 1: Secondary | ICD-10-CM

## 2019-10-23 DIAGNOSIS — R05 Cough: Secondary | ICD-10-CM | POA: Diagnosis not present

## 2019-10-23 DIAGNOSIS — J309 Allergic rhinitis, unspecified: Secondary | ICD-10-CM | POA: Diagnosis not present

## 2019-10-23 DIAGNOSIS — G47419 Narcolepsy without cataplexy: Secondary | ICD-10-CM

## 2019-10-23 DIAGNOSIS — E669 Obesity, unspecified: Secondary | ICD-10-CM

## 2019-10-23 DIAGNOSIS — R059 Cough, unspecified: Secondary | ICD-10-CM

## 2019-10-23 DIAGNOSIS — R7303 Prediabetes: Secondary | ICD-10-CM

## 2019-10-23 DIAGNOSIS — I10 Essential (primary) hypertension: Secondary | ICD-10-CM

## 2019-10-23 NOTE — Progress Notes (Signed)
Patient: Wendy Beck Female    DOB: 24-Jan-1946   74 y.o.   MRN: [id number] Visit Date: 10/23/2019  Today's Provider: Wilhemena Durie, MD   Chief Complaint  Patient presents with  . Cough   Subjective:    Virtual Visit via Telephone Note  I connected with Lorrin Masi Poli on 10/23/19 at 10:20 AM EST by telephone and verified that I am speaking with the correct person using two identifiers.  Location: Patient: Home  Provider: home   I discussed the limitations, risks, security and privacy concerns of performing an evaluation and management service by telephone and the availability of in person appointments. I also discussed with the patient that there may be a patient responsible charge related to this service. The patient expressed understanding and agreed to proceed.    HPI  Pt has multiple allergic exposures at home and has a nonproductive cough and PND. No other symptoms.  Allergies  Allergen Reactions  . Niacin And Related Hives    flushing  . Ezetimibe Other (See Comments)    Increased lft    . Statins Other (See Comments)    Muscle aches Increased lft      Current Outpatient Medications:  .  atorvastatin (LIPITOR) 40 MG tablet, Take by mouth., Disp: , Rfl:  .  Azelastine HCl 137 MCG/SPRAY SOLN, one spray by Both Nostrils route daily., Disp: , Rfl:  .  b complex vitamins tablet, Take 1 tablet by mouth daily with lunch., Disp: , Rfl:  .  bisoprolol-hydrochlorothiazide (ZIAC) 5-6.25 MG tablet, Take 1 tablet by mouth daily., Disp: 90 tablet, Rfl: 3 .  cholecalciferol (VITAMIN D) 1000 units tablet, Take 1,000 Units by mouth daily with lunch., Disp: , Rfl:  .  Coenzyme Q10 (VITALINE COQ10) 300 MG WAFR, Take 300 mg by mouth daily with lunch., Disp: , Rfl:  .  Cyanocobalamin (VITAMIN B-12) 5000 MCG SUBL, Place under the tongue daily., Disp: , Rfl:  .  FLUoxetine (PROZAC) 20 MG capsule, Take 20 mg by mouth daily., Disp: , Rfl:  .  Glucosamine Sulfate (DONA  PO), Take 2 tablets by mouth daily with lunch., Disp: , Rfl:  .  ketoconazole (NIZORAL) 2 % shampoo, APPLY TO AFFECTED AREA ON SKIN AS DIRECTED, Disp: , Rfl:  .  levothyroxine (SYNTHROID) 100 MCG tablet, Take 1 tablet (100 mcg total) by mouth daily before breakfast., Disp: 90 tablet, Rfl: 2 .  losartan (COZAAR) 50 MG tablet, Take 2 tablets (100 mg total) by mouth daily., Disp: 180 tablet, Rfl: 1 .  modafinil (PROVIGIL) 200 MG tablet, Take 0.5 tablets (100 mg total) by mouth daily., Disp: 90 tablet, Rfl: 1 .  Multiple Vitamins-Minerals (HAIR VITAMINS PO), Take 2 tablets by mouth daily with lunch. , Disp: , Rfl:  .  Omega-3 Fatty Acids (FISH OIL ULTRA) 1400 MG CAPS, Take 4,200 mg by mouth daily at 2 PM., Disp: , Rfl:  .  potassium chloride (KLOR-CON) 10 MEQ tablet, Take by mouth., Disp: , Rfl:  .  Cholecalciferol (VITAMIN D3) 5000 units CAPS, Take by mouth daily., Disp: , Rfl:  .  clobetasol (TEMOVATE) 0.05 % external solution, Apply topically., Disp: , Rfl:  .  famotidine (PEPCID) 20 MG tablet, Take 1 tablet (20 mg total) by mouth daily. (Patient not taking: Reported on 10/23/2019), Disp: 30 tablet, Rfl: 1 .  HYDROcodone-acetaminophen (NORCO) 5-325 MG tablet, Take 1-2 tablets by mouth every 4 (four) hours as needed for moderate pain. (Patient not  taking: Reported on 04/21/2019), Disp: 20 tablet, Rfl: 0 .  metFORMIN (GLUCOPHAGE-XR) 500 MG 24 hr tablet, TAKE 1 TABLET BY MOUTH ONCE DAILY WITH BREAKFAST, Disp: , Rfl:  .  omeprazole (PRILOSEC) 20 MG capsule, Take 20 mg by mouth daily., Disp: , Rfl:  .  Pitavastatin Calcium (LIVALO) 4 MG TABS, Take 4 mg by mouth daily. Patient will hold for elevated lft labwork, Disp: , Rfl:  .  sucralfate (CARAFATE) 1 g tablet, Take 1 tablet (1 g total) by mouth 4 (four) times daily. (Patient not taking: Reported on 04/21/2019), Disp: 60 tablet, Rfl: 0  Review of Systems  Constitutional: Negative for appetite change, chills, fatigue and fever.  Respiratory: Negative for  chest tightness and shortness of breath.   Cardiovascular: Negative for chest pain and palpitations.  Gastrointestinal: Negative for abdominal pain, nausea and vomiting.  Neurological: Negative for dizziness and weakness.    Social History   Tobacco Use  . Smoking status: Never Smoker  . Smokeless tobacco: Never Used  Substance Use Topics  . Alcohol use: No      Objective:   Temp 97.8 F (36.6 C) (Other (Comment))  Vitals:   10/23/19 0938  Temp: 97.8 F (36.6 C)  TempSrc: Other (Comment)  There is no height or weight on file to calculate BMI.   Physical Exam   No results found for any visits on 10/23/19.     Assessment & Plan     1. Allergic rhinitis, unspecified seasonality, unspecified trigger   2. Cough Covid testing  3. Primary narcolepsy without cataplexy On provigil  4. Obesity (BMI 30.0-34.9)   5. Prediabetes Weight loss.  6. Hypothyroidism, unspecified type   7. Essential hypertension with goal blood pressure less than 140/90   I discussed the assessment and treatment plan with the patient. The patient was provided an opportunity to ask questions and all were answered. The patient agreed with the plan and demonstrated an understanding of the instructions.   The patient was advised to call back or seek an in-person evaluation if the symptoms worsen or if the condition fails to improve as anticipated.  I provided  62minutes of non-face-to-face time during this encounter.    Richard Cranford Mon, MD  [facility]

## 2019-10-24 ENCOUNTER — Other Ambulatory Visit: Payer: Self-pay

## 2019-10-24 ENCOUNTER — Other Ambulatory Visit: Payer: Medicare Other

## 2019-10-24 ENCOUNTER — Ambulatory Visit: Payer: Medicare Other | Attending: Internal Medicine

## 2019-10-24 ENCOUNTER — Ambulatory Visit: Payer: Medicare Other

## 2019-10-24 DIAGNOSIS — Z23 Encounter for immunization: Secondary | ICD-10-CM

## 2019-10-24 DIAGNOSIS — Z20822 Contact with and (suspected) exposure to covid-19: Secondary | ICD-10-CM

## 2019-10-24 NOTE — Progress Notes (Signed)
Twinrix #2 administered; tolerated well. Patient to RTC in June for #3 vaccine. Aileen Fass, RN

## 2019-10-25 LAB — NOVEL CORONAVIRUS, NAA: SARS-CoV-2, NAA: NOT DETECTED

## 2019-10-25 LAB — SPECIMEN STATUS REPORT

## 2019-10-28 ENCOUNTER — Telehealth: Payer: Self-pay | Admitting: Family Medicine

## 2019-10-28 NOTE — Telephone Encounter (Signed)
Patient informed of negative covid-19 result. Patient verbalized understanding.  °

## 2019-11-10 NOTE — Progress Notes (Signed)
Patient: Wendy Beck Female    DOB: 07-Jan-1946   74 y.o.   MRN: [id number] Visit Date: 11/11/2019  Today's Provider: Wilhemena Durie, MD   Chief Complaint  Patient presents with   Follow-up   Diabetes   Hypertension   Subjective:     HPI  Patient is scheduled for routine follow-up.  He has multiple issues that she brings up during the visit. She has chronic knee pain with weightbearing.  Especially on the right knee. She also complains of chronic foot pain.  She appears to have a corn. She has chronic allergies with occasional cough. She has had her first Covid vaccine. Type 2 diabetes mellitus with other circulatory complication, without [place]-term current use of insulin ([facility]) Patient does not check her sugars at all at home as she does not like sticking her finger. From 06/23/2019-Work on lifestyle. HgB A1C--7.3.  Primary narcolepsy without cataplexy From 10/22/2018-On provigil.  Essential hypertension with goal blood pressure less than 140/90 Currently taking bisoprolol-HCTZ and losartan.  Allergies  Allergen Reactions   Niacin And Related Hives    flushing   Ezetimibe Other (See Comments)    Increased lft     Statins Other (See Comments)    Muscle aches Increased lft      Current Outpatient Medications:    atorvastatin (LIPITOR) 40 MG tablet, Take by mouth., Disp: , Rfl:    Azelastine HCl 137 MCG/SPRAY SOLN, one spray by Both Nostrils route daily., Disp: , Rfl:    b complex vitamins tablet, Take 1 tablet by mouth daily with lunch., Disp: , Rfl:    bisoprolol-hydrochlorothiazide (ZIAC) 5-6.25 MG tablet, Take 1 tablet by mouth daily., Disp: 90 tablet, Rfl: 3   cholecalciferol (VITAMIN D) 1000 units tablet, Take 1,000 Units by mouth daily with lunch., Disp: , Rfl:    Cholecalciferol (VITAMIN D3) 5000 units CAPS, Take by mouth daily., Disp: , Rfl:    Coenzyme Q10 (VITALINE COQ10) 300 MG WAFR, Take 300 mg by mouth daily with lunch., Disp: ,  Rfl:    Cyanocobalamin (VITAMIN B-12) 5000 MCG SUBL, Place under the tongue daily., Disp: , Rfl:    Glucosamine Sulfate (DONA PO), Take 2 tablets by mouth daily with lunch., Disp: , Rfl:    levothyroxine (SYNTHROID) 100 MCG tablet, Take 1 tablet (100 mcg total) by mouth daily before breakfast., Disp: 90 tablet, Rfl: 2   losartan (COZAAR) 50 MG tablet, Take 2 tablets (100 mg total) by mouth daily., Disp: 180 tablet, Rfl: 1   modafinil (PROVIGIL) 200 MG tablet, Take 0.5 tablets (100 mg total) by mouth daily., Disp: 90 tablet, Rfl: 1   Omega-3 Fatty Acids (FISH OIL ULTRA) 1400 MG CAPS, Take 4,200 mg by mouth daily at 2 PM., Disp: , Rfl:    famotidine (PEPCID) 20 MG tablet, Take 1 tablet (20 mg total) by mouth daily. (Patient not taking: Reported on 10/23/2019), Disp: 30 tablet, Rfl: 1   FLUoxetine (PROZAC) 20 MG capsule, Take 20 mg by mouth daily., Disp: , Rfl:    HYDROcodone-acetaminophen (NORCO) 5-325 MG tablet, Take 1-2 tablets by mouth every 4 (four) hours as needed for moderate pain. (Patient not taking: Reported on 04/21/2019), Disp: 20 tablet, Rfl: 0   ketoconazole (NIZORAL) 2 % shampoo, APPLY TO AFFECTED AREA ON SKIN AS DIRECTED, Disp: , Rfl:    metFORMIN (GLUCOPHAGE-XR) 500 MG 24 hr tablet, TAKE 1 TABLET BY MOUTH ONCE DAILY WITH BREAKFAST, Disp: , Rfl:    Multiple  Vitamins-Minerals (HAIR VITAMINS PO), Take 2 tablets by mouth daily with lunch. , Disp: , Rfl:    omeprazole (PRILOSEC) 20 MG capsule, Take 20 mg by mouth daily., Disp: , Rfl:    Pitavastatin Calcium (LIVALO) 4 MG TABS, Take 4 mg by mouth daily. Patient will hold for elevated lft labwork, Disp: , Rfl:    potassium chloride (KLOR-CON) 10 MEQ tablet, Take by mouth., Disp: , Rfl:    sucralfate (CARAFATE) 1 g tablet, Take 1 tablet (1 g total) by mouth 4 (four) times daily. (Patient not taking: Reported on 04/21/2019), Disp: 60 tablet, Rfl: 0  Review of Systems  Constitutional: Negative for appetite change, chills, fatigue  and fever.  HENT: Negative.   Eyes: Negative.   Respiratory: Negative for chest tightness and shortness of breath.   Cardiovascular: Negative for chest pain and palpitations.  Gastrointestinal: Negative for abdominal pain, nausea and vomiting.  Endocrine: Negative.   Musculoskeletal: Positive for arthralgias.  Allergic/Immunologic: Negative.   Neurological: Negative for dizziness and weakness.  Psychiatric/Behavioral: Negative.     Social History   Tobacco Use   Smoking status: Never Smoker   Smokeless tobacco: Never Used  Substance Use Topics   Alcohol use: No      Objective:   BP 128/70 (BP Location: Left Arm, Patient Position: Sitting, Cuff Size: Large)    Pulse 66    Temp (!) 97.5 F (36.4 C) (Other (Comment))    Resp 18    Ht 5\' 3"  (1.6 m)    Wt 195 lb (88.5 kg)    SpO2 96%    BMI 34.54 kg/m  Vitals:   11/11/19 0811  BP: 128/70  Pulse: 66  Resp: 18  Temp: (!) 97.5 F (36.4 C)  TempSrc: Other (Comment)  SpO2: 96%  Weight: 195 lb (88.5 kg)  Height: 5\' 3"  (1.6 m)  Body mass index is 34.54 kg/m.   Physical Exam Vitals reviewed.  Constitutional:      Appearance: She is obese.  HENT:     Head: Normocephalic and atraumatic.     Right Ear: External ear normal.     Left Ear: External ear normal.  Eyes:     General: No scleral icterus.    Conjunctiva/sclera: Conjunctivae normal.  Neck:     Vascular: No carotid bruit.  Cardiovascular:     Rate and Rhythm: Normal rate and regular rhythm.     Heart sounds: Normal heart sounds.  Pulmonary:     Effort: Pulmonary effort is normal.     Breath sounds: Normal breath sounds.  Abdominal:     Palpations: Abdomen is soft.  Musculoskeletal:     Comments: She has mild tenderness of the joint line along her right knee.  Possible minimal effusion of the right knee.  Lymphadenopathy:     Cervical: No cervical adenopathy.  Skin:    General: Skin is warm and dry.     Comments: She appears to have a corn of her foot.  I  do not think this is a callus.  Neurological:     General: No focal deficit present.     Mental Status: She is alert and oriented to person, place, and time.  Psychiatric:        Mood and Affect: Mood normal.        Behavior: Behavior normal.        Thought Content: Thought content normal.      No results found for any visits on 11/11/19.  Assessment & Plan    1. Prediabetes/DM A1c is 8.5.  Patient is diabetic.  She is instructed that she needs to check her blood sugars at least daily.  Patient states she got dizziness with Metformin before.  That would be a very unusual side effect.  Would probably benefit from referral to dietitian/lifestyle - CBC w/Diff/Platelet - Comprehensive Metabolic Panel (CMET) - TSH - Lipid panel  2. Essential hypertension with goal blood pressure less than 140/90 Controlled on bisoprolol/HCTZ and losartan - CBC w/Diff/Platelet - Comprehensive Metabolic Panel (CMET) - TSH - Lipid panel  3. Chronic pain of right knee Osteoarthritis most likely.  Refer to orthopedics. - AMB referral to orthopedics  4. Right foot pain  - Ambulatory referral to Podiatry  5. Hypothyroidism, unspecified type  - CBC w/Diff/Platelet - Comprehensive Metabolic Panel (CMET) - TSH - Lipid panel  6. Hyperlipidemia, unspecified hyperlipidemia type Patient is taking Lipitor every 1/2 tablet every other day of 40 mg dose - CBC w/Diff/Platelet - Comprehensive Metabolic Panel (CMET) - TSH - Lipid panel  7. Type 2 diabetes mellitus with other circulatory complication, without [place]-term current use of insulin ([facility]CC)  - metFORMIN (GLUCOPHAGE-XR) 500 MG 24 hr tablet; TAKE 1 TABLET BY MOUTH ONCE DAILY WITH BREAKFAST  Dispense: 90 tablet; Refill: 1  8. Cough Resolving URI.  This is improving on a regular basis over the last couple of weeks.     Richard Cranford Mon, MD  [facility]

## 2019-11-11 ENCOUNTER — Other Ambulatory Visit: Payer: Self-pay

## 2019-11-11 ENCOUNTER — Ambulatory Visit (INDEPENDENT_AMBULATORY_CARE_PROVIDER_SITE_OTHER): Payer: Medicare Other | Admitting: Family Medicine

## 2019-11-11 ENCOUNTER — Encounter: Payer: Self-pay | Admitting: Family Medicine

## 2019-11-11 VITALS — BP 128/70 | HR 66 | Temp 97.5°F | Resp 18 | Ht 63.0 in | Wt 195.0 lb

## 2019-11-11 DIAGNOSIS — G8929 Other chronic pain: Secondary | ICD-10-CM

## 2019-11-11 DIAGNOSIS — E1159 Type 2 diabetes mellitus with other circulatory complications: Secondary | ICD-10-CM | POA: Diagnosis not present

## 2019-11-11 DIAGNOSIS — R05 Cough: Secondary | ICD-10-CM

## 2019-11-11 DIAGNOSIS — E785 Hyperlipidemia, unspecified: Secondary | ICD-10-CM | POA: Diagnosis not present

## 2019-11-11 DIAGNOSIS — M79671 Pain in right foot: Secondary | ICD-10-CM

## 2019-11-11 DIAGNOSIS — I10 Essential (primary) hypertension: Secondary | ICD-10-CM | POA: Diagnosis not present

## 2019-11-11 DIAGNOSIS — E039 Hypothyroidism, unspecified: Secondary | ICD-10-CM

## 2019-11-11 DIAGNOSIS — R7303 Prediabetes: Secondary | ICD-10-CM | POA: Diagnosis not present

## 2019-11-11 DIAGNOSIS — R059 Cough, unspecified: Secondary | ICD-10-CM

## 2019-11-11 DIAGNOSIS — M25561 Pain in right knee: Secondary | ICD-10-CM

## 2019-11-11 MED ORDER — METFORMIN HCL ER 500 MG PO TB24: ORAL_TABLET | ORAL | 1 refills | Status: DC

## 2019-11-12 LAB — COMPREHENSIVE METABOLIC PANEL WITH GFR
ALT: 41 [IU]/L — ABNORMAL HIGH (ref 0–32)
AST: 21 [IU]/L (ref 0–40)
Albumin/Globulin Ratio: 1.6 (ref 1.2–2.2)
Albumin: 4.2 g/dL (ref 3.7–4.7)
Alkaline Phosphatase: 123 [IU]/L — ABNORMAL HIGH (ref 39–117)
BUN/Creatinine Ratio: 19 (ref 12–28)
BUN: 17 mg/dL (ref 8–27)
Bilirubin Total: 0.6 mg/dL (ref 0.0–1.2)
CO2: 24 mmol/L (ref 20–29)
Calcium: 9.4 mg/dL (ref 8.7–10.3)
Chloride: 101 mmol/L (ref 96–106)
Creatinine, Ser: 0.88 mg/dL (ref 0.57–1.00)
GFR calc Af Amer: 74 mL/min/{1.73_m2}
GFR calc non Af Amer: 64 mL/min/{1.73_m2}
Globulin, Total: 2.7 g/dL (ref 1.5–4.5)
Glucose: 205 mg/dL — ABNORMAL HIGH (ref 65–99)
Potassium: 4.3 mmol/L (ref 3.5–5.2)
Sodium: 139 mmol/L (ref 134–144)
Total Protein: 6.9 g/dL (ref 6.0–8.5)

## 2019-11-12 LAB — CBC WITH DIFFERENTIAL/PLATELET
Basophils Absolute: 0 10*3/uL (ref 0.0–0.2)
Basos: 1 %
EOS (ABSOLUTE): 0.5 10*3/uL — ABNORMAL HIGH (ref 0.0–0.4)
Eos: 8 %
Hematocrit: 43.9 % (ref 34.0–46.6)
Hemoglobin: 14.6 g/dL (ref 11.1–15.9)
Immature Grans (Abs): 0 10*3/uL (ref 0.0–0.1)
Immature Granulocytes: 0 %
Lymphocytes Absolute: 2 10*3/uL (ref 0.7–3.1)
Lymphs: 30 %
MCH: 30 pg (ref 26.6–33.0)
MCHC: 33.3 g/dL (ref 31.5–35.7)
MCV: 90 fL (ref 79–97)
Monocytes Absolute: 0.5 10*3/uL (ref 0.1–0.9)
Monocytes: 7 %
Neutrophils Absolute: 3.6 10*3/uL (ref 1.4–7.0)
Neutrophils: 54 %
Platelets: 207 10*3/uL (ref 150–450)
RBC: 4.86 x10E6/uL (ref 3.77–5.28)
RDW: 11.9 % (ref 11.7–15.4)
WBC: 6.6 10*3/uL (ref 3.4–10.8)

## 2019-11-12 LAB — LIPID PANEL
Chol/HDL Ratio: 3.8 ratio (ref 0.0–4.4)
Cholesterol, Total: 200 mg/dL — ABNORMAL HIGH (ref 100–199)
HDL: 52 mg/dL
LDL Chol Calc (NIH): 120 mg/dL — ABNORMAL HIGH (ref 0–99)
Triglycerides: 156 mg/dL — ABNORMAL HIGH (ref 0–149)
VLDL Cholesterol Cal: 28 mg/dL (ref 5–40)

## 2019-11-12 LAB — TSH: TSH: 3.14 u[IU]/mL (ref 0.450–4.500)

## 2019-11-13 ENCOUNTER — Telehealth: Payer: Self-pay | Admitting: Family Medicine

## 2019-11-13 NOTE — Telephone Encounter (Signed)
Medication Refill - Medication:  modafinil (PROVIGIL) 200 MG tablet  Has the patient contacted their pharmacy? Yes advised to call office. Pt would like a 90 day supply as well as a call when done.  Preferred Pharmacy (with phone number or street name):  [facility] # [place], [place] Phone:  [phone]  Fax:  [phone]     Agent: Please be advised that RX refills may take up to 3 business days. We ask that you follow-up with your pharmacy.

## 2019-11-13 NOTE — Telephone Encounter (Signed)
Requested medication (s) are due for refill today: yes  Requested medication (s) are on the active medication list: yes  Last refill:  06/23/2019  Future visit scheduled: yes  Notes to clinic: no assigned protocol    Requested Prescriptions  Pending Prescriptions Disp Refills   modafinil (PROVIGIL) 200 MG tablet 90 tablet 1    Sig: Take 0.5 tablets (100 mg total) by mouth daily.      Off-Protocol Failed - 11/13/2019 12:00 PM      Failed - Medication not assigned to a protocol, review manually.      Passed - Valid encounter within last 12 months    Recent Outpatient Visits           2 days ago Essential hypertension with goal blood pressure less than 140/90   [facility] Jerrol Banana., MD   3 weeks ago Allergic rhinitis, unspecified seasonality, unspecified trigger   [facility] Jerrol Banana., MD   4 months ago Type 2 diabetes mellitus with other circulatory complication, without [place]-term current use of insulin [facility])   [facility] Jerrol Banana., MD   6 months ago Hypothyroidism, unspecified type   [facility] Jerrol Banana., MD       Future Appointments             In 3 months Jerrol Banana., MD [facility]

## 2019-11-14 ENCOUNTER — Telehealth: Payer: Self-pay | Admitting: *Deleted

## 2019-11-14 NOTE — Telephone Encounter (Signed)
LMOVM for pt to return call. Okay for [facility] triage to give pt results. 

## 2019-11-14 NOTE — Telephone Encounter (Signed)
-----   Message from Jerrol Banana., MD sent at 11/14/2019  2:11 PM EST ----- Labs stable, continue to work on diet and exercise.

## 2019-11-17 ENCOUNTER — Other Ambulatory Visit: Payer: Self-pay | Admitting: Family Medicine

## 2019-11-17 DIAGNOSIS — G47419 Narcolepsy without cataplexy: Secondary | ICD-10-CM

## 2019-11-17 MED ORDER — MODAFINIL 200 MG PO TABS: 100.0000 mg | ORAL_TABLET | Freq: Every day | ORAL | 1 refills | Status: DC

## 2019-11-17 NOTE — Telephone Encounter (Signed)
Patient was advised.  

## 2019-11-18 ENCOUNTER — Other Ambulatory Visit: Payer: Self-pay | Admitting: *Deleted

## 2019-11-18 DIAGNOSIS — G47419 Narcolepsy without cataplexy: Secondary | ICD-10-CM

## 2019-11-18 MED ORDER — MODAFINIL 200 MG PO TABS: 200.0000 mg | ORAL_TABLET | Freq: Every day | ORAL | 1 refills | Status: DC

## 2019-11-25 ENCOUNTER — Telehealth: Payer: Self-pay

## 2019-11-25 NOTE — Telephone Encounter (Signed)
Copied from [place] [id number]. Topic: General - Inquiry >> Nov 25, 2019  3:43 PM Greggory Keen D wrote: Reason for CRM: Pt called saying she is supposed to have her 2nd covid vaccine tomorrow and throughout the day today she has developed a bad cough, sore throat, low grade fever and ear pain.  She would like a nurse to call her back.  She is thinking she can still get the vaccine tomorrow if she gets on an antibiotic....  CB#  [phone]

## 2019-11-25 NOTE — Telephone Encounter (Signed)
Patient advised to get tested for covid and reschedule vaccine.

## 2019-11-26 ENCOUNTER — Ambulatory Visit: Payer: Medicare Other | Attending: Internal Medicine

## 2019-11-26 DIAGNOSIS — Z20822 Contact with and (suspected) exposure to covid-19: Secondary | ICD-10-CM

## 2019-11-27 LAB — NOVEL CORONAVIRUS, NAA: SARS-CoV-2, NAA: NOT DETECTED

## 2019-12-01 ENCOUNTER — Telehealth: Payer: Self-pay

## 2019-12-01 NOTE — Telephone Encounter (Signed)
Copied from [place] [id number]. Topic: General - Other >> Dec 01, 2019  4:57 PM Mcneil, Ja-Kwan wrote: Reason for CRM: Pt stated she went to [facility] and she tested negative for the Covid virus. Pt stated she was given an antibiotic  and she still has 4 days of the medication remaining but she is scheduled for her 2nd Covid vaccine. Pt would like to know if she should go forward with the vaccination. Pt requests call back

## 2019-12-02 NOTE — Telephone Encounter (Signed)
After she is through with antibiotics.

## 2019-12-02 NOTE — Telephone Encounter (Signed)
Patient advised to wait until she finishes antibiotics.

## 2020-01-13 LAB — HM MAMMOGRAPHY

## 2020-01-15 ENCOUNTER — Other Ambulatory Visit: Payer: Self-pay | Admitting: Family Medicine

## 2020-01-15 ENCOUNTER — Telehealth: Payer: Self-pay | Admitting: Family Medicine

## 2020-01-15 DIAGNOSIS — E1159 Type 2 diabetes mellitus with other circulatory complications: Secondary | ICD-10-CM

## 2020-01-15 NOTE — Telephone Encounter (Signed)
Rec'd refill request from pharmacy for Metformin 500 mg ER tab.  Noted that refill was sent on 11/11/19; #90; RF x 1.  Phone call to [facility] to discuss pt's refill request.  Pharmacy Tech reported that pt. picked up Rx for Metformin on 11/11/19; #90, and on 12/28/19; # 90.    Phone call to patient.  Left voice message for pt. to call office to discuss how she is taking her Metformin.  (current order is Metformin 500 mg ER 1 tab qd with breakfast)  At this time refill request for Metformin was refused.

## 2020-02-10 ENCOUNTER — Telehealth: Payer: Self-pay | Admitting: Family Medicine

## 2020-02-10 NOTE — Telephone Encounter (Signed)
Spoke with patient she was doing her taxes and would like a call tomorrow.

## 2020-02-11 ENCOUNTER — Other Ambulatory Visit: Payer: Self-pay

## 2020-02-11 ENCOUNTER — Ambulatory Visit (INDEPENDENT_AMBULATORY_CARE_PROVIDER_SITE_OTHER): Payer: Medicare Other | Admitting: Dermatology

## 2020-02-11 DIAGNOSIS — L814 Other melanin hyperpigmentation: Secondary | ICD-10-CM

## 2020-02-11 DIAGNOSIS — D1801 Hemangioma of skin and subcutaneous tissue: Secondary | ICD-10-CM

## 2020-02-11 DIAGNOSIS — Z1283 Encounter for screening for malignant neoplasm of skin: Secondary | ICD-10-CM | POA: Diagnosis not present

## 2020-02-11 DIAGNOSIS — D2372 Other benign neoplasm of skin of left lower limb, including hip: Secondary | ICD-10-CM

## 2020-02-11 DIAGNOSIS — L578 Other skin changes due to chronic exposure to nonionizing radiation: Secondary | ICD-10-CM | POA: Diagnosis not present

## 2020-02-11 DIAGNOSIS — L821 Other seborrheic keratosis: Secondary | ICD-10-CM | POA: Diagnosis not present

## 2020-02-11 DIAGNOSIS — D229 Melanocytic nevi, unspecified: Secondary | ICD-10-CM | POA: Diagnosis not present

## 2020-02-11 DIAGNOSIS — L719 Rosacea, unspecified: Secondary | ICD-10-CM

## 2020-02-11 DIAGNOSIS — Z85828 Personal history of other malignant neoplasm of skin: Secondary | ICD-10-CM | POA: Diagnosis not present

## 2020-02-11 DIAGNOSIS — L820 Inflamed seborrheic keratosis: Secondary | ICD-10-CM | POA: Diagnosis not present

## 2020-02-11 DIAGNOSIS — D239 Other benign neoplasm of skin, unspecified: Secondary | ICD-10-CM

## 2020-02-11 DIAGNOSIS — D485 Neoplasm of uncertain behavior of skin: Secondary | ICD-10-CM | POA: Diagnosis not present

## 2020-02-11 NOTE — Patient Instructions (Addendum)
Cryotherapy Aftercare  . Wash gently with soap and water everyday.   Marland Kitchen Apply Vaseline and Band-Aid daily until healed.  Recommend daily broad spectrum sunscreen SPF 30+ to sun-exposed areas, reapply every 2 hours as needed. Call for new or changing lesions.  Shave Excision Benign Lesion Wound Care Instructions  . Leave the original bandage on for 24 hours if possible.  If the bandage becomes soaked or soiled before that time, it is OK to remove it and examine the wound.  A small amount of post-operative bleeding is normal.  If excessive bleeding occurs, remove the bandage, place gauze over the site and apply continuous pressure (no peeking) over the area for 20-30 minutes.  If this does not stop the bleeding, try again for 40 minutes.  If this does not work, please call our clinic as soon as possible (even if after-hours).    . Twice a day, cleanse the wound with soap and water.  If a thick crust develops you may use a Q-tip dipped into dilute hydrogen peroxide (mix 1:1 with water) to dissolve it.  Hydrogen peroxide can slow the healing process, so use it only as needed.  After washing, apply Vaseline jelly or Polysporin ointment.  For best healing, the wound should be covered with a layer of ointment at all times.  This may mean re-applying the ointment several times a day.  For open wounds, continue until it has healed.    . If you have any swelling, keep the area elevated.  . Some redness, tenderness and white or yellow material in the wound is normal healing.  If the area becomes very sore and red, or develops a thick yellow-green material (pus), it may be infected; please notify us.    . Wound healing continues for up to one year following surgery.  It is not unusual to experience pain in the scar from time to time during the interval.  If the pain becomes severe or the scar thickens, you should notify the office.  A slight amount of redness in a scar is expected for the first six months.   After six months, the redness subsides and the scar will soften and fade.  The color difference becomes less noticeable with time.  If there are any problems, return for a post-op surgery check at your earliest convenience.  . Please call our office for any questions or concerns.

## 2020-02-11 NOTE — Progress Notes (Signed)
Follow-Up Visit   Subjective  Wendy Beck is a 74 y.o. female who presents for the following: Annual Exam. The patient presents for total body skin examination for skin cancer screening and mole check. Patient here today for TBSE. She has a history of BCC at the right temple that was treated with Moh's about 7 years ago. She also has a history of AK's. There is a spot at right temple area she would like checked, no symptoms but because it's close to site of previous [facility] she is concerned.   The following portions of the chart were reviewed this encounter and updated as appropriate:  Tobacco  Allergies  Meds  Problems  Med Hx  Surg Hx  Fam Hx      Review of Systems:  No other skin or systemic complaints except as noted in HPI or Assessment and Plan.  Objective  Well appearing patient in no apparent distress; mood and affect are within normal limits.  A full examination was performed including scalp, head, eyes, ears, nose, lips, neck, chest, axillae, abdomen, back, buttocks, bilateral upper extremities, bilateral lower extremities, hands, feet, fingers, toes, fingernails, and toenails. All findings within normal limits unless otherwise noted below.  Objective  Left calf: Firm pink/brown papulenodule with dimple sign.   Objective  Right Temple: Well healed scar with no evidence of recurrence.   Objective  Right Zygoma x 1, L calf x 1 (2): Erythematous keratotic or waxy stuck-on papule or plaque.   Objective  Mid to low Back spinal: 1.2cm crusted red patch  Objective  cheeks: Dilated vessels and pinkness   Assessment & Plan  Dermatofibroma Left calf  Benign, observe.    History of basal cell carcinoma (BCC) Right Temple  No evidence of recurrence, call clinic for new or changing lesions.   Inflamed seborrheic keratosis (2) Right Zygoma x 1, L calf x 1  Destruction of lesion - Right Zygoma x 1, L calf x 1 Complexity: simple   Destruction method:  cryotherapy   Informed consent: discussed and consent obtained   Timeout:  patient name, date of birth, surgical site, and procedure verified Lesion destroyed using liquid nitrogen: Yes   Region frozen until ice ball extended beyond lesion: Yes   Outcome: patient tolerated procedure well with no complications   Post-procedure details: wound care instructions given    Neoplasm of uncertain behavior of skin Mid to low Back spinal  Skin / nail biopsy Type of biopsy: tangential   Informed consent: discussed and consent obtained   Timeout: patient name, date of birth, surgical site, and procedure verified   Procedure prep:  Patient was prepped and draped in usual sterile fashion Prep type:  Isopropyl alcohol Anesthesia: the lesion was anesthetized in a standard fashion   Anesthetic:  1% lidocaine w/ epinephrine 1-100,000 buffered w/ 8.4% NaHCO3 Instrument used: flexible razor blade   Hemostasis achieved with: pressure, aluminum chloride and electrodesiccation   Outcome: patient tolerated procedure well   Post-procedure details: sterile dressing applied and wound care instructions given   Dressing type: petrolatum and bandage    Specimen 1 - Surgical pathology Differential Diagnosis: r/o BCC Check Margins: No 1.2cm crusted red patch  Rosacea cheeks  Benign, observe.    Skin cancer screening   Lentigines - Scattered tan macules - Discussed due to sun exposure - Benign, observe - Call for any changes  Seborrheic Keratoses - Stuck-on, waxy, tan-brown papules and plaques  - Discussed benign etiology and prognosis. - Observe -  Call for any changes  Melanocytic Nevi - Tan-brown and/or pink-flesh-colored symmetric macules and papules - Benign appearing on exam today - Observation - Call clinic for new or changing moles - Recommend daily use of broad spectrum spf 30+ sunscreen to sun-exposed areas.   Hemangiomas - Red papules - Discussed benign nature - Observe - Call  for any changes  Actinic Damage - diffuse scaly erythematous macules with underlying dyspigmentation - Recommend daily broad spectrum sunscreen SPF 30+ to sun-exposed areas, reapply every 2 hours as needed.  - Call for new or changing lesions.  Skin cancer screening performed today.  Return in about 6 months (around 08/13/2020) for TBSE.  Graciella Belton, RMA, am acting as scribe for Sarina Ser, MD .   Documentation: I have reviewed the above documentation for accuracy and completeness, and I agree with the above.  Sarina Ser, MD

## 2020-02-14 ENCOUNTER — Other Ambulatory Visit: Payer: Self-pay | Admitting: Family Medicine

## 2020-02-14 DIAGNOSIS — E1159 Type 2 diabetes mellitus with other circulatory complications: Secondary | ICD-10-CM

## 2020-02-17 ENCOUNTER — Telehealth: Payer: Self-pay

## 2020-02-17 MED ORDER — MOMETASONE FUROATE 0.1 % EX CREA: 1.0000 "application " | TOPICAL_CREAM | Freq: Two times a day (BID) | CUTANEOUS | 0 refills | Status: DC

## 2020-02-17 NOTE — Telephone Encounter (Signed)
error 

## 2020-02-17 NOTE — Telephone Encounter (Signed)
Patient advised of biopsy. Scheduled 8 week follow up and Mometasone sent in to pharmacy.

## 2020-02-17 NOTE — Telephone Encounter (Signed)
LM on VM to return my call. 

## 2020-02-17 NOTE — Addendum Note (Signed)
Addended by: Johnsie Kindred R on: 02/17/2020 12:35 PM   Modules accepted: Orders

## 2020-02-19 ENCOUNTER — Encounter: Payer: Self-pay | Admitting: Dermatology

## 2020-03-09 NOTE — Progress Notes (Signed)
Trena Platt Melodi Happel,acting as a scribe for Wilhemena Durie, MD.,have documented all relevant documentation on the behalf of Wilhemena Durie, MD,as directed by  Wilhemena Durie, MD while in the presence of Wilhemena Durie, MD.  Established patient visit   Patient: Wendy Beck   DOB: [birth year]   74 y.o. Female  MRN: [id number] Visit Date: 03/10/2020  Today's healthcare provider: Wilhemena Durie, MD   Chief Complaint  Patient presents with   Diabetes Mellitus   Hypertension   Subjective    HPI  Patient has no new or specific complaints.  She has had both Covid vaccines. Diabetes Mellitus Type II, follow-up  Lab Results  Component Value Date   HGBA1C 7.3 (A) 06/23/2019   Last seen for diabetes 4 months ago.  Management since then includes; A1c is 8.5.  Patient is diabetic. She is instructed that she needs to check her blood sugars at least daily. Patient states she got dizziness with Metformin before. That would be a very unusual side effect. Would probably benefit from referral to dietitian/lifestyle. She reports excellent compliance with treatment. She is not having side effects.   Home blood sugar records: not checking  Episodes of hypoglycemia? No    Current insulin regiment: none Most Recent Eye Exam: overdue  --------------------------------------------------------------------------------------------------- Hypertension, follow-up  BP Readings from Last 3 Encounters:  11/11/19 128/70  06/23/19 (!) 147/79  04/21/19 (!) 195/69   Wt Readings from Last 3 Encounters:  11/11/19 195 lb (88.5 kg)  06/23/19 192 lb 6.4 oz (87.3 kg)  04/21/19 191 lb (86.6 kg)     She was last seen for hypertension 4 months ago.  BP at that visit was 128/70. Management since that visit includes; Controlled on bisoprolol/HCTZ and losartan She reports excellent compliance with treatment. She is not having side effects.  She is not exercising. She is adherent to low  salt diet.   Outside blood pressures are not checked.  She does not smoke.  Use of agents associated with hypertension: none.   ---------------------------------------------------------------------------------------------------       Medications: Outpatient Medications Prior to Visit  Medication Sig   atorvastatin (LIPITOR) 40 MG tablet Take by mouth.   Azelastine HCl 137 MCG/SPRAY SOLN one spray by Both Nostrils route daily.   b complex vitamins tablet Take 1 tablet by mouth daily with lunch.   bisoprolol-hydrochlorothiazide (ZIAC) 5-6.25 MG tablet Take 1 tablet by mouth daily.   cholecalciferol (VITAMIN D) 1000 units tablet Take 1,000 Units by mouth daily with lunch.   Cholecalciferol (VITAMIN D3) 5000 units CAPS Take by mouth daily.   Coenzyme Q10 (VITALINE COQ10) 300 MG WAFR Take 300 mg by mouth daily with lunch.   Cyanocobalamin (VITAMIN B-12) 5000 MCG SUBL Place under the tongue daily.   famotidine (PEPCID) 20 MG tablet Take 1 tablet (20 mg total) by mouth daily.   FLUoxetine (PROZAC) 20 MG capsule Take 20 mg by mouth daily.   Glucosamine Sulfate (DONA PO) Take 2 tablets by mouth daily with lunch.   HYDROcodone-acetaminophen (NORCO) 5-325 MG tablet Take 1-2 tablets by mouth every 4 (four) hours as needed for moderate pain.   ketoconazole (NIZORAL) 2 % shampoo APPLY TO AFFECTED AREA ON SKIN AS DIRECTED   levothyroxine (SYNTHROID) 100 MCG tablet Take 1 tablet (100 mcg total) by mouth daily before breakfast.   losartan (COZAAR) 50 MG tablet Take 2 tablets (100 mg total) by mouth daily.   metFORMIN (GLUCOPHAGE-XR) 500 MG 24 hr tablet TAKE  1 TABLET BY MOUTH ONCE DAILY WITH BREAKFAST   modafinil (PROVIGIL) 200 MG tablet Take 1 tablet (200 mg total) by mouth daily.   mometasone (ELOCON) 0.1 % cream Apply 1 application topically 2 (two) times daily.   Multiple Vitamins-Minerals (HAIR VITAMINS PO) Take 2 tablets by mouth daily with lunch.    Omega-3 Fatty Acids  (FISH OIL ULTRA) 1400 MG CAPS Take 4,200 mg by mouth daily at 2 PM.   omeprazole (PRILOSEC) 20 MG capsule Take 20 mg by mouth daily.   Pitavastatin Calcium (LIVALO) 4 MG TABS Take 4 mg by mouth daily. Patient will hold for elevated lft labwork   potassium chloride (KLOR-CON) 10 MEQ tablet Take by mouth.   sucralfate (CARAFATE) 1 g tablet Take 1 tablet (1 g total) by mouth 4 (four) times daily.   No facility-administered medications prior to visit.    Review of Systems  Constitutional: Negative for appetite change, chills, fatigue and fever.  Respiratory: Negative for chest tightness and shortness of breath.   Cardiovascular: Negative for chest pain and palpitations.  Gastrointestinal: Negative for abdominal pain, nausea and vomiting.  Neurological: Negative for dizziness and weakness.       Objective    There were no vitals taken for this visit. BP Readings from Last 3 Encounters:  03/10/20 123/76  11/11/19 128/70  06/23/19 (!) 147/79   Wt Readings from Last 3 Encounters:  03/10/20 189 lb 6.4 oz (85.9 kg)  11/11/19 195 lb (88.5 kg)  06/23/19 192 lb 6.4 oz (87.3 kg)      Physical Exam Vitals reviewed.  Constitutional:      Appearance: Normal appearance.  HENT:     Head: Normocephalic and atraumatic.     Right Ear: External ear normal.     Left Ear: External ear normal.  Eyes:     General: No scleral icterus.    Conjunctiva/sclera: Conjunctivae normal.  Cardiovascular:     Rate and Rhythm: Normal rate and regular rhythm.     Pulses: Normal pulses.     Heart sounds: Normal heart sounds.  Pulmonary:     Effort: Pulmonary effort is normal.     Breath sounds: Normal breath sounds.  Musculoskeletal:     Right lower leg: No edema.     Left lower leg: No edema.  Skin:    General: Skin is warm and dry.  Neurological:     General: No focal deficit present.     Mental Status: She is alert and oriented to person, place, and time.  Psychiatric:        Mood and  Affect: Mood normal.        Behavior: Behavior normal.        Thought Content: Thought content normal.        Judgment: Judgment normal.       No results found for any visits on 03/10/20.  Assessment & Plan     1. Type 2 diabetes mellitus with other circulatory complication, without Sager-term current use of insulin (HCC) A1c has gone from 7.3-8.5.  Double metformin XR from 500 to 1000 g daily.  May need to add pioglitazone.  Consider either Steglatro or Ozempic depending on insert's coverage if necessary. - POCT HgB A1C - Hepatic function panel  2. Essential hypertension with goal blood pressure less than 140/90 On losartan 50, good control. - bisoprolol-hydrochlorothiazide (ZIAC) 5-6.25 MG tablet; Take 1 tablet by mouth daily.  Dispense: 90 tablet; Refill: 3 - losartan (COZAAR) 50 MG tablet;  Take 2 tablets (100 mg total) by mouth daily.  Dispense: 180 tablet; Refill: 1 - Hepatic function panel  3. Primary narcolepsy without cataplexy Provigil refilled for 6 months. - modafinil (PROVIGIL) 200 MG tablet; Take 1 tablet (200 mg total) by mouth daily.  Dispense: 90 tablet; Refill: 5  4. Hypothyroidism, unspecified type  - levothyroxine (SYNTHROID) 100 MCG tablet; Take 1 tablet (100 mcg total) by mouth daily before breakfast.  Dispense: 90 tablet; Refill: 3  5. Hyperlipidemia, unspecified hyperlipidemia type On Lipitor. - atorvastatin (LIPITOR) 40 MG tablet; Take 1 tablet (40 mg total) by mouth daily.  Dispense: 90 tablet; Refill: 3  6. Venous insufficiency Weight loss and exercise stressed.  Possible support hose.  7. Chronic anxiety   8. Obesity (BMI 30.0-34.9)   9. Vitamin D deficiency    No follow-ups on file.      I, Wilhemena Durie, MD, have reviewed all documentation for this visit. The documentation on 03/15/20 for the exam, diagnosis, procedures, and orders are all accurate and complete.    Richard Cranford Mon, MD  [facility] [phone]  (phone) [phone] (fax)  [place]

## 2020-03-10 ENCOUNTER — Other Ambulatory Visit: Payer: Self-pay

## 2020-03-10 ENCOUNTER — Ambulatory Visit (INDEPENDENT_AMBULATORY_CARE_PROVIDER_SITE_OTHER): Payer: Medicare Other | Admitting: Family Medicine

## 2020-03-10 ENCOUNTER — Encounter: Payer: Self-pay | Admitting: Family Medicine

## 2020-03-10 ENCOUNTER — Telehealth: Payer: Self-pay | Admitting: Family Medicine

## 2020-03-10 VITALS — BP 123/76 | HR 65 | Temp 97.3°F | Ht 63.0 in | Wt 189.4 lb

## 2020-03-10 DIAGNOSIS — E669 Obesity, unspecified: Secondary | ICD-10-CM | POA: Diagnosis not present

## 2020-03-10 DIAGNOSIS — G47419 Narcolepsy without cataplexy: Secondary | ICD-10-CM | POA: Diagnosis not present

## 2020-03-10 DIAGNOSIS — I10 Essential (primary) hypertension: Secondary | ICD-10-CM

## 2020-03-10 DIAGNOSIS — E1159 Type 2 diabetes mellitus with other circulatory complications: Secondary | ICD-10-CM

## 2020-03-10 DIAGNOSIS — F419 Anxiety disorder, unspecified: Secondary | ICD-10-CM | POA: Diagnosis not present

## 2020-03-10 DIAGNOSIS — I872 Venous insufficiency (chronic) (peripheral): Secondary | ICD-10-CM | POA: Diagnosis not present

## 2020-03-10 DIAGNOSIS — E785 Hyperlipidemia, unspecified: Secondary | ICD-10-CM

## 2020-03-10 DIAGNOSIS — E559 Vitamin D deficiency, unspecified: Secondary | ICD-10-CM

## 2020-03-10 DIAGNOSIS — E66811 Obesity, class 1: Secondary | ICD-10-CM

## 2020-03-10 DIAGNOSIS — E039 Hypothyroidism, unspecified: Secondary | ICD-10-CM | POA: Diagnosis not present

## 2020-03-10 LAB — POCT GLYCOSYLATED HEMOGLOBIN (HGB A1C)
Estimated Average Glucose: 197
Hemoglobin A1C: 8.5 % — AB (ref 4.0–5.6)

## 2020-03-10 MED ORDER — MODAFINIL 200 MG PO TABS: 200.0000 mg | ORAL_TABLET | Freq: Every day | ORAL | 5 refills | Status: DC

## 2020-03-10 MED ORDER — ATORVASTATIN CALCIUM 40 MG PO TABS: 40.0000 mg | ORAL_TABLET | Freq: Every day | ORAL | 3 refills | Status: DC

## 2020-03-10 MED ORDER — BISOPROLOL-HYDROCHLOROTHIAZIDE 5-6.25 MG PO TABS: 1.0000 | ORAL_TABLET | Freq: Every day | ORAL | 3 refills | Status: DC

## 2020-03-10 MED ORDER — METFORMIN HCL ER 500 MG PO TB24: ORAL_TABLET | ORAL | 2 refills | Status: DC

## 2020-03-10 MED ORDER — LOSARTAN POTASSIUM 50 MG PO TABS: 100.0000 mg | ORAL_TABLET | Freq: Every day | ORAL | 1 refills | Status: DC

## 2020-03-10 MED ORDER — METFORMIN HCL ER (MOD) 1000 MG PO TB24: ORAL_TABLET | ORAL | 3 refills | Status: DC

## 2020-03-10 MED ORDER — LEVOTHYROXINE SODIUM 100 MCG PO TABS: 100.0000 ug | ORAL_TABLET | Freq: Every day | ORAL | 3 refills | Status: DC

## 2020-03-10 NOTE — Telephone Encounter (Signed)
Medication sent to pharmacy  

## 2020-03-10 NOTE — Telephone Encounter (Signed)
metFORMIN (GLUMETZA) 1000 MG (MOD) 24 hr tablet Medication Date: 03/10/2020 Department: [facility] Ordering/Authorizing: Jerrol Banana., MD  Order Providers  Prescribing Provider Encounter Provider  Jerrol Banana., MD Jerrol Banana., MD  Outpatient Medication Detail   Disp Refills Start End   metFORMIN (GLUMETZA) 1000 MG (MOD) 24 hr tablet 90 tablet 3 03/10/2020    Sig: TAKE 1 TABLET BY MOUTH ONCE DAILY WITH BREAKFAST   Class: Print    Pt was seen this am and went to pick up above RX do not have 1000 mg States pls rewrite script for( 2 ) two 50 0mg  for 90 day at 180 tablets instead of 90 tablets. Pt states must be 90 supply for her ins to cover . Rewrite and re send [facility] [place]

## 2020-03-10 NOTE — Telephone Encounter (Signed)
Patient requesting changes to Rx- was seen today

## 2020-03-15 ENCOUNTER — Telehealth: Payer: Self-pay

## 2020-03-15 NOTE — Telephone Encounter (Signed)
Patient called the office stating her psoriasis has started to flare in her scalp. You have a prescribed her Ketoconazole Shampoo in the past to use. She has asked for a RF of this?

## 2020-03-15 NOTE — Telephone Encounter (Signed)
May have RF for Ketoconazole shampoo. Should make appt for evaluation of scalp.

## 2020-03-16 MED ORDER — KETOCONAZOLE 2 % EX SHAM: 1.0000 "application " | MEDICATED_SHAMPOO | CUTANEOUS | 0 refills | Status: DC

## 2020-03-16 NOTE — Telephone Encounter (Signed)
RX pending. Left message for patient to return my call.

## 2020-03-16 NOTE — Telephone Encounter (Signed)
Patient advised medication sent in. °

## 2020-03-18 ENCOUNTER — Other Ambulatory Visit: Payer: Self-pay

## 2020-03-18 MED ORDER — KETOCONAZOLE 2 % EX SHAM: 1.0000 "application " | MEDICATED_SHAMPOO | CUTANEOUS | 0 refills | Status: DC

## 2020-03-18 NOTE — Progress Notes (Signed)
Patient would like a 90 day supply due to cheaper pricing.

## 2020-03-19 ENCOUNTER — Telehealth: Payer: Self-pay | Admitting: Family Medicine

## 2020-03-19 NOTE — Telephone Encounter (Signed)
Patient would like to know when she is due to have her labwork done because of taking Lipitor

## 2020-03-19 NOTE — Telephone Encounter (Signed)
Patient was advised she has an open order for Hepatic Function Panel.

## 2020-03-21 DIAGNOSIS — M1711 Unilateral primary osteoarthritis, right knee: Secondary | ICD-10-CM | POA: Insufficient documentation

## 2020-03-23 ENCOUNTER — Other Ambulatory Visit: Payer: Self-pay

## 2020-03-23 ENCOUNTER — Ambulatory Visit (LOCAL_COMMUNITY_HEALTH_CENTER): Payer: Medicare Other

## 2020-03-23 DIAGNOSIS — Z23 Encounter for immunization: Secondary | ICD-10-CM

## 2020-03-26 ENCOUNTER — Telehealth: Payer: Self-pay

## 2020-03-26 NOTE — Telephone Encounter (Signed)
Copied from [place] [id number]. Topic: General - Other >> Mar 25, 2020  4:24 PM Oneta Rack wrote: Reason for CRM:   patient returning [facility] call regarding Medicare Wellness appointment. Patient is okay with a telephone call for visit scheduled on  03/31/2020 at 8:20am. Patient would like a call back to confirm best # [phone].

## 2020-03-27 LAB — HEPATIC FUNCTION PANEL
ALT: 42 [IU]/L — ABNORMAL HIGH (ref 0–32)
AST: 21 [IU]/L (ref 0–40)
Albumin: 4.5 g/dL (ref 3.7–4.7)
Alkaline Phosphatase: 113 [IU]/L (ref 48–121)
Bilirubin Total: 0.7 mg/dL (ref 0.0–1.2)
Bilirubin, Direct: 0.18 mg/dL (ref 0.00–0.40)
Total Protein: 6.9 g/dL (ref 6.0–8.5)

## 2020-03-30 ENCOUNTER — Telehealth: Payer: Self-pay | Admitting: *Deleted

## 2020-03-30 NOTE — Telephone Encounter (Signed)
-----   Message from Jerrol Banana., MD sent at 03/29/2020  1:17 PM EDT ----- Liver function stable.

## 2020-03-30 NOTE — Telephone Encounter (Signed)
Tried calling pt with results. No answer and no vm.

## 2020-03-30 NOTE — Progress Notes (Signed)
Subjective:   Wendy Beck is a 74 y.o. female who presents for an Initial Medicare Annual Wellness Visit.  I connected with Merna Baldi today by telephone and verified that I am speaking with the correct person using two identifiers. Location patient: home Location provider: work Persons participating in the virtual visit: patient, provider.   I discussed the limitations, risks, security and privacy concerns of performing an evaluation and management service by telephone and the availability of in person appointments. I also discussed with the patient that there may be a patient responsible charge related to this service. The patient expressed understanding and verbally consented to this telephonic visit.    Interactive audio and video telecommunications were attempted between this provider and patient, however failed, due to patient having technical difficulties OR patient did not have access to video capability.  We continued and completed visit with audio only.  Review of Systems    N/A  Cardiac Risk Factors include: advanced age (>34men, >78 women);dyslipidemia;hypertension;obesity (BMI >30kg/m2)     Objective:    Today's Vitals   03/31/20 0834  PainSc: 1    There is no height or weight on file to calculate BMI.  Advanced Directives 03/31/2020 03/31/2019 03/29/2018 03/22/2018 02/09/2017  Does Patient Have a Medical Advance Directive? Yes No Yes Yes Yes  Type of Paramedic of [place];Living will - [place];Living will [place];Living will -  Does patient want to make changes to medical advance directive? - - - No - Patient declined -  Copy of [place] in Chart? No - copy requested - No - copy requested No - copy requested -  Would patient like information on creating a medical advance directive? - No - Patient declined - - -    Current Medications (verified) Outpatient Encounter Medications as  of 03/31/2020  Medication Sig  . atorvastatin (LIPITOR) 40 MG tablet Take 1 tablet (40 mg total) by mouth daily.  . Azelastine HCl 137 MCG/SPRAY SOLN one spray by Both Nostrils route daily.  Marland Kitchen b complex vitamins tablet Take 1 tablet by mouth daily with lunch.  . bisoprolol-hydrochlorothiazide (ZIAC) 5-6.25 MG tablet Take 1 tablet by mouth daily.  . cholecalciferol (VITAMIN D) 1000 units tablet Take 2,000 Units by mouth daily with lunch.   . Coenzyme Q10 (VITALINE COQ10) 300 MG WAFR Take 300 mg by mouth daily with lunch.  . Cyanocobalamin (VITAMIN B-12) 5000 MCG SUBL Place under the tongue daily.  . diclofenac Sodium (VOLTAREN) 1 % GEL Apply 4 g topically 4 (four) times daily. As needed  . fexofenadine-pseudoephedrine (ALLEGRA-D) 60-120 MG 12 hr tablet Take 1 tablet by mouth 2 (two) times daily.   Marland Kitchen ketoconazole (NIZORAL) 2 % shampoo Apply 1 application topically as directed. Apply 2-3 times weekly. Let sit for 10 minutes before rinsing.  Marland Kitchen levothyroxine (SYNTHROID) 100 MCG tablet Take 1 tablet (100 mcg total) by mouth daily before breakfast.  . losartan (COZAAR) 50 MG tablet Take 2 tablets (100 mg total) by mouth daily.  . metFORMIN (GLUCOPHAGE-XR) 500 MG 24 hr tablet TAKE 2 TABLETS DAILY  . modafinil (PROVIGIL) 200 MG tablet Take 1 tablet (200 mg total) by mouth daily.  . Multiple Vitamins-Minerals (HAIR VITAMINS PO) Take 2 tablets by mouth daily with lunch.   . Omega-3 Fatty Acids (FISH OIL ULTRA) 1400 MG CAPS Take 4,200 mg by mouth daily at 2 PM.  . omeprazole (PRILOSEC) 20 MG capsule Take 20 mg by  mouth daily. As needed  . potassium chloride (KLOR-CON) 10 MEQ tablet Take 10 mEq by mouth daily. As needed  . Cholecalciferol (VITAMIN D3) 5000 units CAPS Take by mouth daily. (Patient not taking: Reported on 03/31/2020)  . famotidine (PEPCID) 20 MG tablet Take 1 tablet (20 mg total) by mouth daily. (Patient not taking: Reported on 03/31/2020)  . FLUoxetine (PROZAC) 20 MG capsule Take 20 mg by mouth  daily. (Patient not taking: Reported on 03/31/2020)  . Glucosamine Sulfate (DONA PO) Take 2 tablets by mouth daily with lunch. (Patient not taking: Reported on 03/31/2020)  . HYDROcodone-acetaminophen (NORCO) 5-325 MG tablet Take 1-2 tablets by mouth every 4 (four) hours as needed for moderate pain. (Patient not taking: Reported on 03/31/2020)  . ketoconazole (NIZORAL) 2 % shampoo APPLY TO AFFECTED AREA ON SKIN AS DIRECTED (Patient not taking: Reported on 03/31/2020)  . mometasone (ELOCON) 0.1 % cream Apply 1 application topically 2 (two) times daily. (Patient not taking: Reported on 03/31/2020)  . Pitavastatin Calcium (LIVALO) 4 MG TABS Take 4 mg by mouth daily. Patient will hold for elevated lft labwork (Patient not taking: Reported on 03/31/2020)  . sucralfate (CARAFATE) 1 g tablet Take 1 tablet (1 g total) by mouth 4 (four) times daily. (Patient not taking: Reported on 03/31/2020)   No facility-administered encounter medications on file as of 03/31/2020.    Allergies (verified) Niacin and related, Ezetimibe, and Statins   History: Past Medical History:  Diagnosis Date  . Anxiety   . Arthritis   . Asthma   . Cancer ([place]) 2014   BCC forehead  . Heart murmur    as a child, and currently  . Hypercholesteremia   . Hypertension   . Hypothyroidism   . Pneumonia   . Sleep apnea   . Thyroid disease    Past Surgical History:  Procedure Laterality Date  . EYE SURGERY    . KNEE ARTHROSCOPY Right 03/29/2018   Procedure: ARTHROSCOPY KNEE, PARTIAL MEDIAL MENISECTOMY, MEDIAL CHONDROPLASTY;  Surgeon: Dereck Leep, MD;  Location: [facility] ORS;  Service: Orthopedics;  Laterality: Right;  . SHOULDER SURGERY    . thumb surgery    . TUBAL LIGATION     Family History  Problem Relation Age of Onset  . Liver cancer Father    Social History   Socioeconomic History  . Marital status: Married    Spouse name: Not on file  . Number of children: 2  . Years of education: Not on file  . Highest education  level: Some college, no degree  Occupational History  . Occupation: retired  Tobacco Use  . Smoking status: Never Smoker  . Smokeless tobacco: Never Used  Vaping Use  . Vaping Use: Never used  Substance and Sexual Activity  . Alcohol use: No  . Drug use: No  . Sexual activity: Not on file  Other Topics Concern  . Not on file  Social History Narrative  . Not on file   Social Determinants of Health   Financial Resource Strain: Low Risk   . Difficulty of Paying Living Expenses: Not hard at all  Food Insecurity: No Food Insecurity  . Worried About Charity fundraiser in the Last Year: Never true  . Ran Out of Food in the Last Year: Never true  Transportation Needs: No Transportation Needs  . Lack of Transportation (Medical): No  . Lack of Transportation (Non-Medical): No  Physical Activity: Inactive  . Days of Exercise per Week: 0 days  .  Minutes of Exercise per Session: 0 min  Stress: No Stress Concern Present  . Feeling of Stress : Not at all  Social Connections: Moderately Integrated  . Frequency of Communication with Friends and Family: More than three times a week  . Frequency of Social Gatherings with Friends and Family: Twice a week  . Attends Religious Services: More than 4 times per year  . Active Member of Clubs or Organizations: No  . Attends Archivist Meetings: Never  . Marital Status: Married    Tobacco Counseling Counseling given: Not Answered   Clinical Intake:  Pre-visit preparation completed: Yes  Pain : 0-10 Pain Score: 1  (Hurts more with walking.) Pain Type: Chronic pain (Due to previous surgery.) Pain Location: Knee Pain Orientation: Right Pain Descriptors / Indicators: Aching Pain Frequency: Intermittent Pain Relieving Factors: Massaging the area.  Pain Relieving Factors: Massaging the area.  Nutritional Risks: None Diabetes: No  How often do you need to have someone help you when you read instructions, pamphlets, or other  written materials from your doctor or pharmacy?: 1 - Never  Diabetic? Yes  Nutrition Risk Assessment:  Has the patient had any N/V/D within the last 2 months?  No  Does the patient have any non-healing wounds?  No  Has the patient had any unintentional weight loss or weight gain?  No   Diabetes:  Is the patient diabetic?  Yes  If diabetic, was a CBG obtained today?  No  Did the patient bring in their glucometer from home?  No  How often do you monitor your CBG's? Does not check BS.   Financial Strains and Diabetes Management:  Are you having any financial strains with the device, your supplies or your medication? No .  Does the patient want to be seen by Chronic Care Management for management of their diabetes?  No  Would the patient like to be referred to a Nutritionist or for Diabetic Management?  No   Diabetic Exams:  Diabetic Eye Exam: Overdue for diabetic eye exam. Pt has been advised about the importance in completing this exam. Patient advised to call and schedule an eye exam. Diabetic Foot Exam: Overdue, Pt has been advised about the importance in completing this exam. Note made to follow up on this at next in office apt.   Interpreter Needed?: No  Information entered by :: [facility], LPN   Activities of Daily Living In your present state of health, do you have any difficulty performing the following activities: 03/31/2020 04/21/2019  Hearing? N N  Vision? Y N  Comment Needs a new eye glasses prescription. Pt plans to set up an eye exam this year. Declined referral today. -  Difficulty concentrating or making decisions? N N  Walking or climbing stairs? Y Y  Comment Due to knee pains. -  Dressing or bathing? N N  Doing errands, shopping? N N  Preparing Food and eating ? N -  Using the Toilet? N -  In the past six months, have you accidently leaked urine? N -  Do you have problems with loss of bowel control? N -  Managing your Medications? N -  Managing your  Finances? N -  Housekeeping or managing your Housekeeping? N -  Some recent data might be hidden    Patient Care Team: Jerrol Banana., MD as PCP - General (Family Medicine) Marry Guan, Laurice Record, MD (Orthopedic Surgery) Ralene Bathe, MD (Dermatology) Rodney Booze as Physician Assistant  Indicate  any recent Medical Services you may have received from other than [facility] providers in the past year (date may be approximate).     Assessment:   This is a routine wellness examination for Soul.  Hearing/Vision screen No exam data present  Dietary issues and exercise activities discussed: Current Exercise Habits: The patient does not participate in regular exercise at present, Exercise limited by: orthopedic condition(s)  Goals    . Prevent falls     Recommend to remove any items from the home that may cause slips or trips.      Depression Screen PHQ 2/9 Scores 03/31/2020 04/21/2019  PHQ - 2 Score 0 2  PHQ- 9 Score - 4    Fall Risk Fall Risk  03/31/2020 11/11/2019  Falls in the past year? 1 1  Number falls in past yr: 1 1  Injury with Fall? 0 0  Risk for fall due to : Orthopedic patient History of fall(s)  Follow up Falls prevention discussed Falls evaluation completed    Any stairs in or around the home? Yes  If so, are there any without handrails? No  Home free of loose throw rugs in walkways, pet beds, electrical cords, etc? Yes  Adequate lighting in your home to reduce risk of falls? Yes   ASSISTIVE DEVICES UTILIZED TO PREVENT FALLS:  Life alert? No  Use of a cane, walker or w/c? No  Grab bars in the bathroom? Yes  Shower chair or bench in shower? Yes  Elevated toilet seat or a handicapped toilet? Yes    Cognitive Function: Declined today.         Immunizations Immunization History  Administered Date(s) Administered  . Fluad Quad(high Dose 65+) 08/05/2019  . Hep A / Hep B 09/16/2019, 10/24/2019, 03/23/2020  . Influenza Split 06/26/2008,  06/21/2009, 08/12/2010  . Influenza, High Dose Seasonal PF 06/19/2014, 07/02/2017, 08/14/2017, 06/26/2018  . Influenza,inj,Quad PF,6+ Mos 07/08/2012  . Influenza,inj,quad, With Preservative 07/12/2015  . Pneumococcal Conjugate-13 01/20/2015  . Pneumococcal-Unspecified 08/14/2011    TDAP status: Due, Education has been provided regarding the importance of this vaccine. Advised may receive this vaccine at local pharmacy or Health Dept. Aware to provide a copy of the vaccination record if obtained from local pharmacy or Health Dept. Verbalized acceptance and understanding. Flu Vaccine status: Up to date Pneumococcal vaccine status: Up to date Covid-19 vaccine status: Completed vaccines  Qualifies for Shingles Vaccine? Yes   Zostavax completed No   Shingrix Completed?: No.    Education has been provided regarding the importance of this vaccine. Patient has been advised to call insurance company to determine out of pocket expense if they have not yet received this vaccine. Advised may also receive vaccine at local pharmacy or Health Dept. Verbalized acceptance and understanding.  Screening Tests Health Maintenance  Topic Date Due  . Hepatitis C Screening  Never done  . COVID-19 Vaccine (1) Never done  . TETANUS/TDAP  03/31/2021 (Originally 02/28/1965)  . INFLUENZA VACCINE  05/02/2020  . DEXA SCAN  07/23/2024  . PNA vac Low Risk Adult  Completed    Health Maintenance  Health Maintenance Due  Topic Date Due  . Hepatitis C Screening  Never done  . COVID-19 Vaccine (1) Never done    Colorectal cancer screening: No longer required.  Mammogram status: Completed 01/13/20. Repeat every year Bone Density status: Completed 07/24/19. Results reflect: Bone density results: OSTEOPENIA. Repeat every 5 years.  Lung Cancer Screening: (Low Dose CT Chest recommended if Age 32-80 years, 29  pack-year currently smoking OR have quit w/in 15years.) does not qualify.   Additional Screening:  Hepatitis C  Screening: does qualify and would like this added to next in office blood work orders.   Vision Screening: Recommended annual ophthalmology exams for early detection of glaucoma and other disorders of the eye. Is the patient up to date with their annual eye exam?  Yes  Who is the provider or what is the name of the office in which the patient attends annual eye exams? Pt is unsure of the name or facility.  If pt is not established with a provider, would they like to be referred to a provider to establish care? No .   Dental Screening: Recommended annual dental exams for proper oral hygiene  Community Resource Referral / Chronic Care Management: CRR required this visit?  No   CCM required this visit?  No      Plan:     I have personally reviewed and noted the following in the patient's chart:   . Medical and social history . Use of alcohol, tobacco or illicit drugs  . Current medications and supplements . Functional ability and status . Nutritional status . Physical activity . Advanced directives . List of other physicians . Hospitalizations, surgeries, and ER visits in previous 12 months . Vitals . Screenings to include cognitive, depression, and falls . Referrals and appointments  In addition, I have reviewed and discussed with patient certain preventive protocols, quality metrics, and best practice recommendations. A written personalized care plan for preventive services as well as general preventive health recommendations were provided to patient.     Nuriyah Hanline [place], [place]   7/71/1657   Nurse Notes: Pt would like to have the Hep C lab order added to next in office blood work orders. Advised pt to bring in her COVID vaccine card to next apt.

## 2020-03-31 ENCOUNTER — Other Ambulatory Visit: Payer: Self-pay

## 2020-03-31 ENCOUNTER — Ambulatory Visit (INDEPENDENT_AMBULATORY_CARE_PROVIDER_SITE_OTHER): Payer: Medicare Other

## 2020-03-31 DIAGNOSIS — Z0000 Encounter for general adult medical examination without abnormal findings: Secondary | ICD-10-CM

## 2020-03-31 NOTE — Patient Instructions (Signed)
Wendy Beck , Thank you for taking time to come for your Medicare Wellness Visit. I appreciate your ongoing commitment to your health goals. Please review the following plan we discussed and let me know if I can assist you in the future.   Screening recommendations/referrals: Colonoscopy: No longer required.  Mammogram: No longer required.  Bone Density: Up to date, due 07/2024 Recommended yearly ophthalmology/optometry visit for glaucoma screening and checkup Recommended yearly dental visit for hygiene and checkup  Vaccinations: Influenza vaccine: Done 08/05/19 Pneumococcal vaccine: Completed series Tdap vaccine: Currently due, declined today.  Shingles vaccine: Currently due, declined today.   Advanced directives: Please bring a copy of your POA (Power of Attorney) and/or Living Will to your next appointment.   Conditions/risks identified: Fall risk preventatives discussed today.   Next appointment: 07/14/75 @ 8:40 AM with Dr Rosanna Randy    Preventive Care 76 Years and Older, Female Preventive care refers to lifestyle choices and visits with your health care provider that can promote health and wellness. What does preventive care include?  A yearly physical exam. This is also called an annual well check.  Dental exams once or twice a year.  Routine eye exams. Ask your health care provider how often you should have your eyes checked.  Personal lifestyle choices, including:  Daily care of your teeth and gums.  Regular physical activity.  Eating a healthy diet.  Avoiding tobacco and drug use.  Limiting alcohol use.  Practicing safe sex.  Taking low-dose aspirin every day.  Taking vitamin and mineral supplements as recommended by your health care provider. What happens during an annual well check? The services and screenings done by your health care provider during your annual well check will depend on your age, overall health, lifestyle risk factors, and family history of  disease. Counseling  Your health care provider may ask you questions about your:  Alcohol use.  Tobacco use.  Drug use.  Emotional well-being.  Home and relationship well-being.  Sexual activity.  Eating habits.  History of falls.  Memory and ability to understand (cognition).  Work and work Statistician.  Reproductive health. Screening  You may have the following tests or measurements:  Height, weight, and BMI.  Blood pressure.  Lipid and cholesterol levels. These may be checked every 5 years, or more frequently if you are over 40 years old.  Skin check.  Lung cancer screening. You may have this screening every year starting at age 76 if you have a 30-pack-year history of smoking and currently smoke or have quit within the past 15 years.  Fecal occult blood test (FOBT) of the stool. You may have this test every year starting at age 76.  Flexible sigmoidoscopy or colonoscopy. You may have a sigmoidoscopy every 5 years or a colonoscopy every 10 years starting at age 40.  Hepatitis C blood test.  Hepatitis B blood test.  Sexually transmitted disease (STD) testing.  Diabetes screening. This is done by checking your blood sugar (glucose) after you have not eaten for a while (fasting). You may have this done every 1-3 years.  Bone density scan. This is done to screen for osteoporosis. You may have this done starting at age 76.  Mammogram. This may be done every 1-2 years. Talk to your health care provider about how often you should have regular mammograms. Talk with your health care provider about your test results, treatment options, and if necessary, the need for more tests. Vaccines  Your health care provider may recommend  certain vaccines, such as:  Influenza vaccine. This is recommended every year.  Tetanus, diphtheria, and acellular pertussis (Tdap, Td) vaccine. You may need a Td booster every 10 years.  Zoster vaccine. You may need this after age  76.  Pneumococcal 13-valent conjugate (PCV13) vaccine. One dose is recommended after age 76.  Pneumococcal polysaccharide (PPSV23) vaccine. One dose is recommended after age 76. Talk to your health care provider about which screenings and vaccines you need and how often you need them. This information is not intended to replace advice given to you by your health care provider. Make sure you discuss any questions you have with your health care provider. Document Released: 10/15/2015 Document Revised: 06/07/2016 Document Reviewed: 07/20/2015 Elsevier Interactive Patient Education  2017 [employer] Prevention in the Home Falls can cause injuries. They can happen to people of all ages. There are many things you can do to make your home safe and to help prevent falls. What can I do on the outside of my home?  Regularly fix the edges of walkways and driveways and fix any cracks.  Remove anything that might make you trip as you walk through a door, such as a raised step or threshold.  Trim any bushes or trees on the path to your home.  Use bright outdoor lighting.  Clear any walking paths of anything that might make someone trip, such as rocks or tools.  Regularly check to see if handrails are loose or broken. Make sure that both sides of any steps have handrails.  Any raised decks and porches should have guardrails on the edges.  Have any leaves, snow, or ice cleared regularly.  Use sand or salt on walking paths during winter.  Clean up any spills in your garage right away. This includes oil or grease spills. What can I do in the bathroom?  Use night lights.  Install grab bars by the toilet and in the tub and shower. Do not use towel bars as grab bars.  Use non-skid mats or decals in the tub or shower.  If you need to sit down in the shower, use a plastic, non-slip stool.  Keep the floor dry. Clean up any water that spills on the floor as soon as it happens.  Remove  soap buildup in the tub or shower regularly.  Attach bath mats securely with double-sided non-slip rug tape.  Do not have throw rugs and other things on the floor that can make you trip. What can I do in the bedroom?  Use night lights.  Make sure that you have a light by your bed that is easy to reach.  Do not use any sheets or blankets that are too big for your bed. They should not hang down onto the floor.  Have a firm chair that has side arms. You can use this for support while you get dressed.  Do not have throw rugs and other things on the floor that can make you trip. What can I do in the kitchen?  Clean up any spills right away.  Avoid walking on wet floors.  Keep items that you use a lot in easy-to-reach places.  If you need to reach something above you, use a strong step stool that has a grab bar.  Keep electrical cords out of the way.  Do not use floor polish or wax that makes floors slippery. If you must use wax, use non-skid floor wax.  Do not have throw rugs and other  things on the floor that can make you trip. What can I do with my stairs?  Do not leave any items on the stairs.  Make sure that there are handrails on both sides of the stairs and use them. Fix handrails that are broken or loose. Make sure that handrails are as [place] as the stairways.  Check any carpeting to make sure that it is firmly attached to the stairs. Fix any carpet that is loose or worn.  Avoid having throw rugs at the top or bottom of the stairs. If you do have throw rugs, attach them to the floor with carpet tape.  Make sure that you have a light switch at the top of the stairs and the bottom of the stairs. If you do not have them, ask someone to add them for you. What else can I do to help prevent falls?  Wear shoes that:  Do not have high heels.  Have rubber bottoms.  Are comfortable and fit you well.  Are closed at the toe. Do not wear sandals.  If you use a  stepladder:  Make sure that it is fully opened. Do not climb a closed stepladder.  Make sure that both sides of the stepladder are locked into place.  Ask someone to hold it for you, if possible.  Clearly mark and make sure that you can see:  Any grab bars or handrails.  First and last steps.  Where the edge of each step is.  Use tools that help you move around (mobility aids) if they are needed. These include:  Canes.  Walkers.  Scooters.  Crutches.  Turn on the lights when you go into a dark area. Replace any light bulbs as soon as they burn out.  Set up your furniture so you have a clear path. Avoid moving your furniture around.  If any of your floors are uneven, fix them.  If there are any pets around you, be aware of where they are.  Review your medicines with your doctor. Some medicines can make you feel dizzy. This can increase your chance of falling. Ask your doctor what other things that you can do to help prevent falls. This information is not intended to replace advice given to you by your health care provider. Make sure you discuss any questions you have with your health care provider. Document Released: 07/15/2009 Document Revised: 02/24/2016 Document Reviewed: 10/23/2014 Elsevier Interactive Patient Education  2017 [employer].

## 2020-04-06 NOTE — Telephone Encounter (Signed)
Patient advised as below.  

## 2020-04-14 ENCOUNTER — Ambulatory Visit: Payer: Medicare Other | Admitting: Dermatology

## 2020-04-29 ENCOUNTER — Other Ambulatory Visit: Payer: Self-pay | Admitting: *Deleted

## 2020-04-29 DIAGNOSIS — E1159 Type 2 diabetes mellitus with other circulatory complications: Secondary | ICD-10-CM

## 2020-04-29 MED ORDER — METFORMIN HCL ER 500 MG PO TB24: ORAL_TABLET | ORAL | 1 refills | Status: DC

## 2020-05-05 ENCOUNTER — Encounter: Payer: Self-pay | Admitting: Dermatology

## 2020-05-05 ENCOUNTER — Other Ambulatory Visit: Payer: Self-pay

## 2020-05-05 ENCOUNTER — Ambulatory Visit (INDEPENDENT_AMBULATORY_CARE_PROVIDER_SITE_OTHER): Payer: Medicare Other | Admitting: Dermatology

## 2020-05-05 DIAGNOSIS — L820 Inflamed seborrheic keratosis: Secondary | ICD-10-CM | POA: Diagnosis not present

## 2020-05-05 DIAGNOSIS — D485 Neoplasm of uncertain behavior of skin: Secondary | ICD-10-CM | POA: Diagnosis not present

## 2020-05-05 DIAGNOSIS — S80872A Other superficial bite, left lower leg, initial encounter: Secondary | ICD-10-CM

## 2020-05-05 DIAGNOSIS — L821 Other seborrheic keratosis: Secondary | ICD-10-CM | POA: Diagnosis not present

## 2020-05-05 DIAGNOSIS — L905 Scar conditions and fibrosis of skin: Secondary | ICD-10-CM

## 2020-05-05 DIAGNOSIS — D492 Neoplasm of unspecified behavior of bone, soft tissue, and skin: Secondary | ICD-10-CM

## 2020-05-05 DIAGNOSIS — T148XXA Other injury of unspecified body region, initial encounter: Secondary | ICD-10-CM

## 2020-05-05 DIAGNOSIS — L409 Psoriasis, unspecified: Secondary | ICD-10-CM

## 2020-05-05 NOTE — Patient Instructions (Addendum)
Cryotherapy Aftercare   Wash gently with soap and water everyday.    Apply Vaseline and Band-Aid daily until healed. Melanoma ABCDEs  Melanoma is the most dangerous type of skin cancer, and is the leading cause of death from skin disease.  You are more likely to develop melanoma if you: Have light-colored skin, light-colored eyes, or red or blond hair Spend a lot of time in the sun Tan regularly, either outdoors or in a tanning bed Have had blistering sunburns, especially during childhood Have a close family member who has had a melanoma Have atypical moles or large birthmarks  Early detection of melanoma is key since treatment is typically straightforward and cure rates are extremely high if we catch it early.   The first sign of melanoma is often a change in a mole or a new dark spot.  The ABCDE system is a way of remembering the signs of melanoma.  A for asymmetry:  The two halves do not match. B for border:  The edges of the growth are irregular. C for color:  A mixture of colors are present instead of an even brown color. D for diameter:  Melanomas are usually (but not always) greater than 22mm - the size of a pencil eraser. E for evolution:  The spot keeps changing in size, shape, and color.  Please check your skin once per month between visits. You can use a small mirror in front and a large mirror behind you to keep an eye on the back side or your body.   If you see any new or changing lesions before your next follow-up, please call to schedule a visit.  Please continue daily skin protection including broad spectrum sunscreen SPF 30+ to sun-exposed areas, reapplying every 2 hours as needed when you're outdoors.  Wound Care Instructions  On the day following your surgery, you should begin doing daily dressing changes: Remove the old dressing and discard it. Cleanse the wound gently with tap water. This may be done in the shower or by placing a wet gauze pad directly on the  wound and letting it soak for several minutes. It is important to gently remove any dried blood from the wound in order to encourage healing. This may be done by gently rolling a moistened Q-tip on the dried blood. Do not pick at the wound. If the wound should start to bleed, continue cleaning the wound, then place a moist gauze pad on the wound and hold pressure for a few minutes.  Make sure you then dry the skin surrounding the wound completely or the tape will not stick to the skin. Do not use cotton balls on the wound. After the wound is clean and dry, apply the ointment gently with a Q-tip. Cut a non-stick pad to fit the size of the wound. Lay the pad flush to the wound. If the wound is draining, you may want to reinforce it with a small amount of gauze on top of the non-stick pad for a little added compression to the area. Use the tape to seal the area completely. Select from the following with respect to your individual situation: If your wound has been stitched closed: continue the above steps 1-8 at least daily until your sutures are removed. If your wound has been left open to heal: continue steps 1-8 at least daily for the first 3-4 weeks. We would like for you to take a few extra precautions for at least the next week. Sleep with your  head elevated on pillows if our wound is on your head. Do not bend over or lift heavy items to reduce the chance of elevated blood pressure to the wound Do not participate in particularly strenuous activities.   Below is a list of dressing supplies you might need.  Cotton-tipped applicators - Q-tips Gauze pads (2x2 and/or 4x4) - All-Purpose Sponges Non-stick dressing material - Telfa Tape - Paper or Hypafix New and clean tube of petroleum jelly - Vaseline    Comments on Post-Operative Period Slight swelling and redness often appear around the wound. This is normal and will disappear within several days following the surgery. The healing wound will  drain a brownish-red-yellow discharge during healing. This is a normal phase of wound healing. As the wound begins to heal, the drainage may increase in amount. Again, this drainage is normal. Notify us if the drainage becomes persistently bloody, excessively swollen, or intensely painful or develops a foul odor or red streaks.  If you should experience mild discomfort during the healing phase, you may take an aspirin-free medication such as Tylenol (acetaminophen). Notify us if the discomfort is severe or persistent. Avoid alcoholic beverages when taking pain medicine.  In Case of Wound Hemorrhage A wound hemorrhage is when the bandage suddenly becomes soaked with bright red blood and flows profusely. If this happens, sit down or lie down with your head elevated. If the wound has a dressing on it, do not remove the dressing. Apply pressure to the existing gauze. If the wound is not covered, use a gauze pad to apply pressure and continue applying the pressure for 20 minutes without peeking. DO NOT COVER THE WOUND WITH A LARGE TOWEL OR [place] CLOTH. Release your hand from the wound site but do not remove the dressing. If the bleeding has stopped, gently clean around the wound. Leave the dressing in place for 24 hours if possible. This wait time allows the blood vessels to close off so that you do not spark a new round of bleeding by disrupting the newly clotted blood vessels with an immediate dressing change. If the bleeding does not subside, continue to hold pressure. If matters are out of your control, contact an After Hours clinic or go to the Emergency Room.

## 2020-05-05 NOTE — Progress Notes (Signed)
   Follow-Up Visit   Subjective  Wendy Beck is a 74 y.o. female who presents for the following: Skin Problem (8 weeks f/u biopsy proven Bite reaction on the mid to lower back paraspinal ).  The following portions of the chart were reviewed this encounter and updated as appropriate:  Tobacco  Allergies  Meds  Problems  Med Hx  Surg Hx  Fam Hx     Review of Systems:  No other skin or systemic complaints except as noted in HPI or Assessment and Plan.  Objective  Well appearing patient in no apparent distress; mood and affect are within normal limits.  A focused examination was performed including face, lower legs, arms . Relevant physical exam findings are noted in the Assessment and Plan.  Objective  Left Lower Leg - Posterior: Resolving Pink crusty patch   Objective  L dorsum hand (2): Erythematous keratotic or waxy stuck-on papule or plaque.   Objective  Scalp: Well-demarcated erythematous papules/plaques with silvery scale, guttate pink scaly papules.   Objective  mid to lower back paraspinal: Pink healing area, post biopsy   Objective  L upper back paraspinal: 0.6 cm pink papule    Assessment & Plan  Bites Left Lower Leg - Posterior  Start Mometasone cream apply to skin qd-bid   Inflamed seborrheic keratosis (2) L dorsum hand  Destruction of lesion - L dorsum hand Complexity: simple   Destruction method: cryotherapy   Informed consent: discussed and consent obtained   Timeout:  patient name, date of birth, surgical site, and procedure verified Lesion destroyed using liquid nitrogen: Yes   Region frozen until ice ball extended beyond lesion: Yes   Outcome: patient tolerated procedure well with no complications   Post-procedure details: wound care instructions given    Psoriasis Scalp  Psoriasis/Seborrheic dermatitis   Cont Ketoconazole 2% shampoo use 3-4 days a week prn  Scar mid to lower back paraspinal  Biopsy proven Bite reaction    Cont Mometasone cream prn   Neoplasm of skin L upper back paraspinal  Epidermal / dermal shaving  Lesion diameter (cm):  0.6 Informed consent: discussed and consent obtained   Timeout: patient name, date of birth, surgical site, and procedure verified   Procedure prep:  Patient was prepped and draped in usual sterile fashion Prep type:  Isopropyl alcohol Anesthesia: the lesion was anesthetized in a standard fashion   Anesthetic:  1% lidocaine w/ epinephrine 1-100,000 buffered w/ 8.4% NaHCO3 Hemostasis achieved with: pressure, aluminum chloride and electrodesiccation   Outcome: patient tolerated procedure well   Post-procedure details: sterile dressing applied and wound care instructions given   Dressing type: bandage and petrolatum    Specimen 1 - Surgical pathology Differential Diagnosis: R/O  Dysplastic nevus vs Nevus  Check Margins: No 0.6 cm pink papule  Seborrheic Keratoses - Stuck-on, waxy, tan-brown papules and plaques  - Discussed benign etiology and prognosis. - Observe - Call for any changes  Return in about 6 months (around 11/05/2020) for TBSE.  IMarye Round, CMA, am acting as scribe for Sarina Ser, MD .  Documentation: I have reviewed the above documentation for accuracy and completeness, and I agree with the above.  Sarina Ser, MD

## 2020-05-13 ENCOUNTER — Telehealth: Payer: Self-pay

## 2020-05-13 NOTE — Telephone Encounter (Signed)
-----   Message from Ralene Bathe, MD sent at 05/12/2020 11:45 AM EDT ----- Skin , left upper back paraspinal NEUROFIBROMA, IRRITATED  Benign irritated "mole"

## 2020-05-13 NOTE — Telephone Encounter (Signed)
Advised patient of results/hd  

## 2020-07-09 NOTE — Progress Notes (Signed)
I,April Miller,acting as a scribe for Wilhemena Durie, MD.,have documented all relevant documentation on the behalf of Wilhemena Durie, MD,as directed by  Wilhemena Durie, MD while in the presence of Wilhemena Durie, MD.  Established patient visit   Patient: Wendy Beck   DOB: 03-Dec-1945   74 y.o. Female  MRN: [id number] Visit Date: 07/13/2020  Today's healthcare provider: Wilhemena Durie, MD   Chief Complaint  Patient presents with  . Diabetes  . Follow-up  . Hypertension   Subjective    HPI  Patient is lost 12 pounds since her last visit with [place] diet plan. She also complains of ongoing right lateral foot pain.  Is been a chronic problem. He has chronic OA pain of the knee. Be[place]ies hypertension hyperlipidemia all apparently stable. Diabetes Mellitus Type II, follow-up  Lab Results  Component Value Date   HGBA1C 6.3 (A) 07/13/2020   HGBA1C 8.5 (A) 03/10/2020   HGBA1C 7.3 (A) 06/23/2019   Last seen for diabetes 4 months ago.  Management since then includes; Double metformin XR from 500 to 1000 g daily. May need to add pioglitazone.  Consider either Steglatro or Ozempic depending on insert's coverage if necessary. She reports good compliance with treatment. She is not having side effects. none  Home blood sugar records: fasting range: not checking  Episodes of hypoglycemia? No none   Current insulin regiment: n/a Most Recent Eye Exam: due  -------------------------------------------------------------------- Hypertension, follow-up  BP Readings from Last 3 Encounters:  07/13/20 (!) 154/65  03/10/20 123/76  11/11/19 128/70   Wt Readings from Last 3 Encounters:  07/13/20 177 lb (80.3 kg)  03/10/20 189 lb 6.4 oz (85.9 kg)  11/11/19 195 lb (88.5 kg)     She was last seen for hypertension 4 months ago.  BP at that visit was 123/76. Management since that visit includes; On losartan 50, good control. She reports good compliance with  treatment. She is not having side effects. none She is exercising. She is not adherent to low salt diet.   Outside blood pressures are normal.  She does not smoke.  Use of agents associated with hypertension: none.   --------------------------------------------------------------------  Primary narcolepsy without cataplexy From 03/10/2020-Provigil refilled for 6 months.      Medications: Outpatient Medications Prior to Visit  Medication Sig  . atorvastatin (LIPITOR) 40 MG tablet Take 1 tablet (40 mg total) by mouth daily.  . Azelastine HCl 137 MCG/SPRAY SOLN one spray by Both Nostrils route daily.  Marland Kitchen b complex vitamins tablet Take 1 tablet by mouth daily with lunch.  . bisoprolol-hydrochlorothiazide (ZIAC) 5-6.25 MG tablet Take 1 tablet by mouth daily.  . cholecalciferol (VITAMIN D) 1000 units tablet Take 2,000 Units by mouth daily with lunch.   . Cholecalciferol (VITAMIN D3) 5000 units CAPS Take by mouth daily.   . Coenzyme Q10 (VITALINE COQ10) 300 MG WAFR Take 300 mg by mouth daily with lunch.  . Cyanocobalamin (VITAMIN B-12) 5000 MCG SUBL Place under the tongue daily.  . diclofenac Sodium (VOLTAREN) 1 % GEL Apply 4 g topically 4 (four) times daily. As needed  . fexofenadine-pseudoephedrine (ALLEGRA-D) 60-120 MG 12 hr tablet Take 1 tablet by mouth 2 (two) times daily.   Marland Kitchen FLUoxetine (PROZAC) 20 MG capsule Take 20 mg by mouth daily.   . Glucosamine Sulfate (DONA PO) Take 2 tablets by mouth daily with lunch.   Marland Kitchen HYDROcodone-acetaminophen (NORCO) 5-325 MG tablet Take 1-2 tablets by mouth every 4 (four)  hours as needed for moderate pain.  Marland Kitchen ketoconazole (NIZORAL) 2 % shampoo APPLY TO AFFECTED AREA ON SKIN AS DIRECTED  . ketoconazole (NIZORAL) 2 % shampoo Apply 1 application topically as directed. Apply 2-3 times weekly. Let sit for 10 minutes before rinsing.  Marland Kitchen levothyroxine (SYNTHROID) 100 MCG tablet Take 1 tablet (100 mcg total) by mouth daily before breakfast.  . losartan (COZAAR)  50 MG tablet Take 2 tablets (100 mg total) by mouth daily.  . metFORMIN (GLUCOPHAGE-XR) 500 MG 24 hr tablet TAKE 2 TABLETS DAILY (Patient taking differently: TAKE 1 TABLET DAILY)  . modafinil (PROVIGIL) 200 MG tablet Take 1 tablet (200 mg total) by mouth daily.  . mometasone (ELOCON) 0.1 % cream Apply 1 application topically 2 (two) times daily.  . Multiple Vitamins-Minerals (HAIR VITAMINS PO) Take 2 tablets by mouth daily with lunch.   . Omega-3 Fatty Acids (FISH OIL ULTRA) 1400 MG CAPS Take 4,200 mg by mouth daily at 2 PM.  . omeprazole (PRILOSEC) 20 MG capsule Take 20 mg by mouth daily. As needed  . Pitavastatin Calcium (LIVALO) 4 MG TABS Take 4 mg by mouth daily. Patient will hold for elevated lft labwork   . potassium chloride (KLOR-CON) 10 MEQ tablet Take 10 mEq by mouth daily. As needed  . sucralfate (CARAFATE) 1 g tablet Take 1 tablet (1 g total) by mouth 4 (four) times daily.  . famotidine (PEPCID) 20 MG tablet Take 1 tablet (20 mg total) by mouth daily. (Patient not taking: Reported on 03/31/2020)   No facility-administered medications prior to visit.    Review of Systems  Constitutional: Negative for appetite change, chills, fatigue and fever.  Respiratory: Negative for chest tightness and shortness of breath.   Cardiovascular: Negative for chest pain and palpitations.  Gastrointestinal: Negative for abdominal pain, nausea and vomiting.  Neurological: Negative for dizziness and weakness.    Last hemoglobin A1c Lab Results  Component Value Date   HGBA1C 6.3 (A) 07/13/2020      Objective    BP (!) 154/65 (BP Location: Left Arm, Patient Position: Sitting, Cuff Size: Large)   Pulse 63   Temp 98.5 F (36.9 C) (Oral)   Resp 16   Ht 5\' 3"  (1.6 m)   Wt 177 lb (80.3 kg)   SpO2 100%   BMI 31.35 kg/m    Physical Exam Vitals reviewed.  Constitutional:      Appearance: Normal appearance.  HENT:     Head: Normocephalic and atraumatic.     Right Ear: External ear normal.      Left Ear: External ear normal.  Eyes:     General: No scleral icterus.    Conjunctiva/sclera: Conjunctivae normal.  Cardiovascular:     Rate and Rhythm: Normal rate and regular rhythm.     Pulses: Normal pulses.     Heart sounds: Normal heart sounds.  Pulmonary:     Effort: Pulmonary effort is normal.     Breath sounds: Normal breath sounds.  Abdominal:     Palpations: Abdomen is soft.  Musculoskeletal:     Right lower leg: No edema.     Left lower leg: No edema.  Skin:    General: Skin is warm and dry.  Neurological:     General: No focal deficit present.     Mental Status: She is alert and oriented to person, place, and time.  Psychiatric:        Mood and Affect: Mood normal.  Behavior: Behavior normal.        Thought Content: Thought content normal.        Judgment: Judgment normal.     BP (!) 154/65 (BP Location: Left Arm, Patient Position: Sitting, Cuff Size: Large)   Pulse 63   Temp 98.5 F (36.9 C) (Oral)   Resp 16   Ht 5\' 3"  (1.6 m)   Wt 177 lb (80.3 kg)   SpO2 100%   BMI 31.35 kg/m   General Appearance:    Alert, cooperative, no distress, appears stated age     Results for orders placed or performed in visit on 07/13/20  POCT glycosylated hemoglobin (Hb A1C)  Result Value Ref Range   Hemoglobin A1C 6.3 (A) 4.0 - 5.6 %   Est. average glucose Bld gHb Est-mCnc 134     Assessment & Plan     1. Type 2 diabetes mellitus with other circulatory complication, without [place]-term current use of insulin (HCC)  - POCT glycosylated hemoglobin (Hb A1C) 6.3 today - Comprehensive Metabolic Panel (CMET)  2. Essential hypertension with goal blood pressure less than 140/90  - Comprehensive Metabolic Panel (CMET)  3. Hyperlipidemia, unspecified hyperlipidemia type  - Comprehensive Metabolic Panel (CMET) - Lipid panel  4. Need for immunization against influenza  - Flu Vaccine QUAD High Dose(Fluad) 5.Foot Pain Prefer to podiatry  Return in about 4  months (around 11/13/2020).         Rainer Mounce Cranford Mon, MD  [facility] [phone] (phone) [phone] (fax)  [place]

## 2020-07-13 ENCOUNTER — Other Ambulatory Visit: Payer: Self-pay

## 2020-07-13 ENCOUNTER — Ambulatory Visit (INDEPENDENT_AMBULATORY_CARE_PROVIDER_SITE_OTHER): Payer: Medicare Other | Admitting: Family Medicine

## 2020-07-13 ENCOUNTER — Encounter: Payer: Self-pay | Admitting: Family Medicine

## 2020-07-13 VITALS — BP 154/65 | HR 63 | Temp 98.5°F | Resp 16 | Ht 63.0 in | Wt 177.0 lb

## 2020-07-13 DIAGNOSIS — G8929 Other chronic pain: Secondary | ICD-10-CM | POA: Diagnosis not present

## 2020-07-13 DIAGNOSIS — Z23 Encounter for immunization: Secondary | ICD-10-CM | POA: Diagnosis not present

## 2020-07-13 DIAGNOSIS — I10 Essential (primary) hypertension: Secondary | ICD-10-CM

## 2020-07-13 DIAGNOSIS — M79673 Pain in unspecified foot: Secondary | ICD-10-CM

## 2020-07-13 DIAGNOSIS — E1159 Type 2 diabetes mellitus with other circulatory complications: Secondary | ICD-10-CM

## 2020-07-13 DIAGNOSIS — E785 Hyperlipidemia, unspecified: Secondary | ICD-10-CM | POA: Diagnosis not present

## 2020-07-13 LAB — POCT GLYCOSYLATED HEMOGLOBIN (HGB A1C)
Est. average glucose Bld gHb Est-mCnc: 134
Hemoglobin A1C: 6.3 % — AB (ref 4.0–5.6)

## 2020-07-14 LAB — LIPID PANEL
Chol/HDL Ratio: 3.8 ratio (ref 0.0–4.4)
Cholesterol, Total: 173 mg/dL (ref 100–199)
HDL: 45 mg/dL
LDL Chol Calc (NIH): 110 mg/dL — ABNORMAL HIGH (ref 0–99)
Triglycerides: 100 mg/dL (ref 0–149)
VLDL Cholesterol Cal: 18 mg/dL (ref 5–40)

## 2020-07-14 LAB — COMPREHENSIVE METABOLIC PANEL WITH GFR
ALT: 42 [IU]/L — ABNORMAL HIGH (ref 0–32)
AST: 28 [IU]/L (ref 0–40)
Albumin/Globulin Ratio: 1.9 (ref 1.2–2.2)
Albumin: 4.4 g/dL (ref 3.7–4.7)
Alkaline Phosphatase: 108 [IU]/L (ref 44–121)
BUN/Creatinine Ratio: 23 (ref 12–28)
BUN: 22 mg/dL (ref 8–27)
Bilirubin Total: 0.6 mg/dL (ref 0.0–1.2)
CO2: 25 mmol/L (ref 20–29)
Calcium: 9.9 mg/dL (ref 8.7–10.3)
Chloride: 103 mmol/L (ref 96–106)
Creatinine, Ser: 0.96 mg/dL (ref 0.57–1.00)
GFR calc Af Amer: 66 mL/min/{1.73_m2}
GFR calc non Af Amer: 58 mL/min/{1.73_m2} — ABNORMAL LOW
Globulin, Total: 2.3 g/dL (ref 1.5–4.5)
Glucose: 127 mg/dL — ABNORMAL HIGH (ref 65–99)
Potassium: 4.2 mmol/L (ref 3.5–5.2)
Sodium: 140 mmol/L (ref 134–144)
Total Protein: 6.7 g/dL (ref 6.0–8.5)

## 2020-08-09 ENCOUNTER — Other Ambulatory Visit: Payer: Self-pay | Admitting: Family Medicine

## 2020-08-09 DIAGNOSIS — I10 Essential (primary) hypertension: Secondary | ICD-10-CM

## 2020-08-09 NOTE — Telephone Encounter (Signed)
Requested Prescriptions  Pending Prescriptions Disp Refills  . losartan (COZAAR) 50 MG tablet [Pharmacy Med Name: Losartan Potassium 50 MG Oral Tablet] 180 tablet 0    Sig: Take 2 tablets by mouth once daily     Cardiovascular:  Angiotensin Receptor Blockers Failed - 08/09/2020 11:47 AM      Failed - Last BP in normal range    BP Readings from Last 1 Encounters:  07/13/20 (!) 154/65         Passed - Cr in normal range and within 180 days    Creatinine, Ser  Date Value Ref Range Status  07/13/2020 0.96 0.57 - 1.00 mg/dL Final         Passed - K in normal range and within 180 days    Potassium  Date Value Ref Range Status  07/13/2020 4.2 3.5 - 5.2 mmol/L Final         Passed - Patient is not pregnant      Passed - Valid encounter within last 6 months    Recent Outpatient Visits          3 weeks ago Type 2 diabetes mellitus with other circulatory complication, without [place]-term current use of insulin ([place])   [facility] Jerrol Banana., MD   5 months ago Type 2 diabetes mellitus with other circulatory complication, without [place]-term current use of insulin [facility])   [facility] Jerrol Banana., MD   9 months ago Essential hypertension with goal blood pressure less than 140/90   [facility] Jerrol Banana., MD   9 months ago Allergic rhinitis, unspecified seasonality, unspecified trigger   [facility] Jerrol Banana., MD   1 year ago Type 2 diabetes mellitus with other circulatory complication, without [place]-term current use of insulin [facility])   [facility] Jerrol Banana., MD      Future Appointments            In 2 days Ralene Bathe, MD [place]   In 3 months Jerrol Banana., MD [facility]

## 2020-08-11 ENCOUNTER — Ambulatory Visit (INDEPENDENT_AMBULATORY_CARE_PROVIDER_SITE_OTHER): Payer: Medicare Other | Admitting: Dermatology

## 2020-08-11 ENCOUNTER — Other Ambulatory Visit: Payer: Self-pay

## 2020-08-11 DIAGNOSIS — L820 Inflamed seborrheic keratosis: Secondary | ICD-10-CM | POA: Diagnosis not present

## 2020-08-11 DIAGNOSIS — L821 Other seborrheic keratosis: Secondary | ICD-10-CM | POA: Diagnosis not present

## 2020-08-11 DIAGNOSIS — S01311A Laceration without foreign body of right ear, initial encounter: Secondary | ICD-10-CM

## 2020-08-11 DIAGNOSIS — L650 Telogen effluvium: Secondary | ICD-10-CM

## 2020-08-11 DIAGNOSIS — L578 Other skin changes due to chronic exposure to nonionizing radiation: Secondary | ICD-10-CM

## 2020-08-11 DIAGNOSIS — L409 Psoriasis, unspecified: Secondary | ICD-10-CM

## 2020-08-11 DIAGNOSIS — Z1283 Encounter for screening for malignant neoplasm of skin: Secondary | ICD-10-CM

## 2020-08-11 MED ORDER — KETOCONAZOLE 2 % EX SHAM: 1.0000 "application " | MEDICATED_SHAMPOO | CUTANEOUS | 1 refills | Status: DC

## 2020-08-11 MED ORDER — MOMETASONE FUROATE 0.1 % EX SOLN: Freq: Every day | CUTANEOUS | 0 refills | Status: DC

## 2020-08-11 MED ORDER — MOMETASONE FUROATE 0.1 % EX CREA: 1.0000 "application " | TOPICAL_CREAM | Freq: Every day | CUTANEOUS | 0 refills | Status: DC | PRN

## 2020-08-11 NOTE — Progress Notes (Signed)
Follow-Up Visit   Subjective  Wendy Beck is a 74 y.o. female who presents for the following: Other (Spots of chest and shoulder that are irritating.) and Rash (of ears. She thinks she may be allergic to masks). The patient presents for Upper Body Skin Exam (UBSE) for skin cancer screening and mole check.  The following portions of the chart were reviewed this encounter and updated as appropriate:  Tobacco  Allergies  Meds  Problems  Med Hx  Surg Hx  Fam Hx     Review of Systems:  No other skin or systemic complaints except as noted in HPI or Assessment and Plan.  Objective  Well appearing patient in no apparent distress; mood and affect are within normal limits.  All skin waist up examined.  Objective  Scalp: Pinkness and scale of postauricular areas and scalp  Objective  Chest (16): Erythematous keratotic or waxy stuck-on papule or plaque.   Objective  Right Ear: Tear of earlobe  Objective  Right forehead (2): Stuck-on, waxy, tan-brown papule or plaque --Discussed benign etiology and prognosis.   Objective  Scalp: Diffuse thinning of hair.   Assessment & Plan    Actinic Damage - chronic, secondary to cumulative UV radiation exposure/sun exposure over time - diffuse scaly erythematous macules with underlying dyspigmentation - Recommend daily broad spectrum sunscreen SPF 30+ to sun-exposed areas, reapply every 2 hours as needed.  - Call for new or changing lesions.  Psoriasis Scalp; ears /Sebopsoriasis - likely exacerbated by mask  Continue Ketoconazole 2% shampoo 3-4 times per week scalp prn Start Mometasone solution qd up to 4 days per week to scalp  Start Mometasone cream qd up to 4 days per week to non-hairy areas (ears)   Psoriasis is a chronic non-curable, but treatable genetic/hereditary disease that may have other systemic features affecting other organ systems such as joints (Psoriatic Arthritis). It is associated with an increased risk of  inflammatory bowel disease, heart disease, non-alcoholic fatty liver disease, and depression.    mometasone (ELOCON) 0.1 % cream - Scalp mometasone (ELOCON) 0.1 % lotion - Scalp  Reordered Medications ketoconazole (NIZORAL) 2 % shampoo  Inflamed seborrheic keratosis Chest x16  Destruction of lesion - Chest Complexity: simple   Destruction method: cryotherapy   Informed consent: discussed and consent obtained   Timeout:  patient name, date of birth, surgical site, and procedure verified Lesion destroyed using liquid nitrogen: Yes   Region frozen until ice ball extended beyond lesion: Yes   Outcome: patient tolerated procedure well with no complications   Post-procedure details: wound care instructions given    Tear of right earlobe, initial encounter Right Ear Discussed surgical repair of earlobe - patient declines today.  Seborrheic keratosis (2) Right forehead  Discussed Ln2. Advised patient fee is $60 for first lesion and $15 each additional.  Destruction of lesion - Right forehead Complexity: simple   Destruction method: cryotherapy   Informed consent: discussed and consent obtained   Timeout:  patient name, date of birth, surgical site, and procedure verified Lesion destroyed using liquid nitrogen: Yes   Region frozen until ice ball extended beyond lesion: Yes   Outcome: patient tolerated procedure well with no complications   Post-procedure details: wound care instructions given    Telogen effluvium - chronic; multiple causes Scalp Reviewed TSH labs from 11/2019 - WNL Recommend Biotin 2.5 mg qd po; Rogaine for women 5% bid; Red light treatments.  Discussed condition and treatment options - telogen effluvium and red light treatment  info given  Return for Follow up as scheduled.  I, Ashok Cordia, CMA, am acting as scribe for Sarina Ser, MD .  Documentation: I have reviewed the above documentation for accuracy and completeness, and I agree with the  above.  Sarina Ser, MD

## 2020-08-11 NOTE — Patient Instructions (Signed)

## 2020-08-12 ENCOUNTER — Encounter: Payer: Self-pay | Admitting: Dermatology

## 2020-08-17 LAB — HM DIABETES EYE EXAM

## 2020-08-23 ENCOUNTER — Encounter: Payer: Self-pay | Admitting: *Deleted

## 2020-09-29 ENCOUNTER — Telehealth: Payer: Self-pay

## 2020-09-29 NOTE — Telephone Encounter (Signed)
Copied from CRM [id number]. Topic: Appointment Scheduling - Scheduling Inquiry for Clinic >> Sep 29, 2020  4:05 PM Dalphine Handing A wrote: Patient wants to come into office. Advised patient that she would need a virtual appointment based on symptoms. Patient feels she has pneumonemia , throat discomfort,fever, and a cough and only wants to come in office to get xray. Please advise

## 2020-09-30 ENCOUNTER — Ambulatory Visit (INDEPENDENT_AMBULATORY_CARE_PROVIDER_SITE_OTHER): Payer: Medicare Other | Admitting: Physician Assistant

## 2020-09-30 ENCOUNTER — Encounter: Payer: Self-pay | Admitting: Physician Assistant

## 2020-09-30 DIAGNOSIS — J0110 Acute frontal sinusitis, unspecified: Secondary | ICD-10-CM

## 2020-09-30 DIAGNOSIS — J40 Bronchitis, not specified as acute or chronic: Secondary | ICD-10-CM

## 2020-09-30 MED ORDER — PREDNISONE 10 MG PO TABS: ORAL_TABLET | ORAL | 0 refills | Status: DC

## 2020-09-30 MED ORDER — DOXYCYCLINE HYCLATE 100 MG PO TABS: 100.0000 mg | ORAL_TABLET | Freq: Two times a day (BID) | ORAL | 0 refills | Status: AC

## 2020-09-30 NOTE — Telephone Encounter (Signed)
LMOVM for pt to return call. Scheduled patient for 1:00 with Adriana for virtual or telephone visit.

## 2020-09-30 NOTE — Progress Notes (Signed)
Virtual telephone visit    Virtual Visit via Telephone Note   This visit type was conducted due to national recommendations for restrictions regarding the COVID-19 Pandemic (e.g. social distancing) in an effort to limit this patient's exposure and mitigate transmission in our community. Due to her co-morbid illnesses, this patient is at least at moderate risk for complications without adequate follow up. This format is felt to be most appropriate for this patient at this time. The patient did not have access to video technology or had technical difficulties with video requiring transitioning to audio format only (telephone). Physical exam was limited to content and character of the telephone converstion.    Patient location: Home Provider location: Office   I discussed the limitations of evaluation and management by telemedicine and the availability of in person appointments. The patient expressed understanding and agreed to proceed.   Visit Date: 09/30/2020  Today's healthcare provider: Trey SailorsAdriana M Sherby Moncayo, PA-C   Chief Complaint  Patient presents with  . Cough  . Sore Throat   Subjective    HPI   Patient with history of hypertension and hypothyroidism presenting today for three weeks of ongoing URI symptoms. Reports she was seen at urgent care several weeks ago and COVID negative, treated conservatively with supportive measures. Feels her sx never resolved and now have progressed to worsening sinus congestion and cough. Some SOB after coughing, reports temperature of 100F. + body aches.       Medications: Outpatient Medications Prior to Visit  Medication Sig  . atorvastatin (LIPITOR) 40 MG tablet Take 1 tablet (40 mg total) by mouth daily.  . Azelastine HCl 137 MCG/SPRAY SOLN one spray by Both Nostrils route daily.  Marland Kitchen. b complex vitamins tablet Take 1 tablet by mouth daily with lunch.  . bisoprolol-hydrochlorothiazide (ZIAC) 5-6.25 MG tablet Take 1 tablet by mouth daily.  .  cholecalciferol (VITAMIN D) 1000 units tablet Take 2,000 Units by mouth daily with lunch.   . Cholecalciferol (VITAMIN D3) 5000 units CAPS Take by mouth daily.   . Coenzyme Q10 300 MG WAFR Take 300 mg by mouth daily with lunch.  . Cyanocobalamin (VITAMIN B-12) 5000 MCG SUBL Place under the tongue daily.  . diclofenac Sodium (VOLTAREN) 1 % GEL Apply 4 g topically 4 (four) times daily. As needed  . fexofenadine-pseudoephedrine (ALLEGRA-D) 60-120 MG 12 hr tablet Take 1 tablet by mouth 2 (two) times daily.   Marland Kitchen. FLUoxetine (PROZAC) 20 MG capsule Take 20 mg by mouth daily.   . Glucosamine Sulfate (DONA PO) Take 2 tablets by mouth daily with lunch.   . ketoconazole (NIZORAL) 2 % shampoo Apply 1 application topically as directed. Apply 2-3 times weekly. Let sit for 10 minutes before rinsing.  Marland Kitchen. levothyroxine (SYNTHROID) 100 MCG tablet Take 1 tablet (100 mcg total) by mouth daily before breakfast.  . losartan (COZAAR) 50 MG tablet Take 2 tablets by mouth once daily  . metFORMIN (GLUCOPHAGE-XR) 500 MG 24 hr tablet TAKE 2 TABLETS DAILY (Patient taking differently: TAKE 1 TABLET DAILY)  . modafinil (PROVIGIL) 200 MG tablet Take 1 tablet (200 mg total) by mouth daily.  . Multiple Vitamins-Minerals (HAIR VITAMINS PO) Take 2 tablets by mouth daily with lunch.   . Omega-3 Fatty Acids (FISH OIL ULTRA) 1400 MG CAPS Take 4,200 mg by mouth daily at 2 PM.  . potassium chloride (KLOR-CON) 10 MEQ tablet Take 10 mEq by mouth daily. As needed  . famotidine (PEPCID) 20 MG tablet Take 1 tablet (20 mg  total) by mouth daily. (Patient not taking: Reported on 03/31/2020)  . HYDROcodone-acetaminophen (NORCO) 5-325 MG tablet Take 1-2 tablets by mouth every 4 (four) hours as needed for moderate pain. (Patient not taking: Reported on 09/30/2020)  . mometasone (ELOCON) 0.1 % cream Apply 1 application topically 2 (two) times daily. (Patient not taking: Reported on 09/30/2020)  . mometasone (ELOCON) 0.1 % cream Apply 1 application  topically daily as needed (Rash). (Patient not taking: Reported on 09/30/2020)  . mometasone (ELOCON) 0.1 % lotion Apply topically daily. (Patient not taking: Reported on 09/30/2020)  . omeprazole (PRILOSEC) 20 MG capsule Take 20 mg by mouth daily. As needed (Patient not taking: Reported on 09/30/2020)  . Pitavastatin Calcium 4 MG TABS Take 4 mg by mouth daily. Patient will hold for elevated lft labwork  (Patient not taking: Reported on 09/30/2020)  . sucralfate (CARAFATE) 1 g tablet Take 1 tablet (1 g total) by mouth 4 (four) times daily. (Patient not taking: Reported on 09/30/2020)   No facility-administered medications prior to visit.    Review of Systems  Constitutional: Negative for appetite change, chills, fatigue and fever.  Respiratory: Positive for cough. Negative for chest tightness and shortness of breath.   Cardiovascular: Negative for chest pain and palpitations.  Gastrointestinal: Negative for abdominal pain, nausea and vomiting.  Neurological: Negative for dizziness and weakness.      Objective    There were no vitals taken for this visit.     Assessment & Plan    1. Acute non-recurrent frontal sinusitis  Treat for sinusitis with abx that also covers for PNA. CXR patient defers today but can get if symptoms persist. Will test for COVID.   - doxycycline (VIBRA-TABS) 100 MG tablet; Take 1 tablet (100 mg total) by mouth 2 (two) times daily for 7 days.  Dispense: 14 tablet; Refill: 0 - DG Chest 2 View; Future  2. Bronchitis  - doxycycline (VIBRA-TABS) 100 MG tablet; Take 1 tablet (100 mg total) by mouth 2 (two) times daily for 7 days.  Dispense: 14 tablet; Refill: 0 - COVID-19, Flu A+B and RSV - predniSONE (DELTASONE) 10 MG tablet; Take 6 pills on day 1, 5 pills on day 2 and so on until complete.  Dispense: 21 tablet; Refill: 0 - DG Chest 2 View; Future   No follow-ups on file.    I discussed the assessment and treatment plan with the patient. The patient was  provided an opportunity to ask questions and all were answered. The patient agreed with the plan and demonstrated an understanding of the instructions.   The patient was advised to call back or seek an in-person evaluation if the symptoms worsen or if the condition fails to improve as anticipated.  I provided 25 minutes of non-face-to-face time during this encounter.  ITrey Sailors, PA-C, have reviewed all documentation for this visit. The documentation on 09/30/20 for the exam, diagnosis, procedures, and orders are all accurate and complete.  The entirety of the information documented in the History of Present Illness, Review of Systems and Physical Exam were personally obtained by me. Portions of this information were initially documented by April M. Hyacinth Meeker, CMA and reviewed by me for thoroughness and accuracy.    Maryella Shivers [facility] [phone] (phone) [phone] (fax)  [facility]

## 2020-10-01 ENCOUNTER — Other Ambulatory Visit: Payer: Self-pay | Admitting: Family Medicine

## 2020-10-01 DIAGNOSIS — G47419 Narcolepsy without cataplexy: Secondary | ICD-10-CM

## 2020-10-01 DIAGNOSIS — I10 Essential (primary) hypertension: Secondary | ICD-10-CM

## 2020-10-01 DIAGNOSIS — E1159 Type 2 diabetes mellitus with other circulatory complications: Secondary | ICD-10-CM

## 2020-10-02 LAB — COVID-19, FLU A+B AND RSV
Influenza A, NAA: NOT DETECTED
Influenza B, NAA: NOT DETECTED
RSV, NAA: NOT DETECTED
SARS-CoV-2, NAA: DETECTED — AB

## 2020-10-04 NOTE — Telephone Encounter (Signed)
Requested medication (s) are due for refill today: yes  Requested medication (s) are on the active medication list: yes  Last refill:  08/16/2020  Future visit scheduled: yes  Notes to clinic:   10/01/2020 04:05 PM  Protocol Details  Medication not assigned to a protocol, review manually    Requested Prescriptions  Pending Prescriptions Disp Refills   modafinil (PROVIGIL) 200 MG tablet [Pharmacy Med Name: Modafinil 200 MG Oral Tablet] 90 tablet 0    Sig: Take 1 tablet by mouth once daily      Off-Protocol Failed - 10/01/2020  4:05 PM      Failed - Medication not assigned to a protocol, review manually.      Passed - Valid encounter within last 12 months    Recent Outpatient Visits           4 days ago Acute non-recurrent frontal sinusitis   [facility] Carles Collet M, [place]   2 months ago Type 2 diabetes mellitus with other circulatory complication, without Sledge-term current use of insulin [facility])   [facility] Jerrol Banana., MD   6 months ago Type 2 diabetes mellitus with other circulatory complication, without Patricelli-term current use of insulin [facility])   [facility] Jerrol Banana., MD   10 months ago Essential hypertension with goal blood pressure less than 140/90   [facility] Jerrol Banana., MD   11 months ago Allergic rhinitis, unspecified seasonality, unspecified trigger   [facility] Jerrol Banana., MD       Future Appointments             In 1 month Jerrol Banana., MD [facility]              Signed Prescriptions Disp Refills   metFORMIN (GLUCOPHAGE-XR) 500 MG 24 hr tablet 180 tablet 0    Sig: Take 2 tablets by mouth once daily      Endocrinology:  Diabetes - Biguanides Failed - 10/01/2020  4:05 PM      Failed - eGFR in normal range and within 360 days    GFR calc Af Amer  Date Value Ref Range Status  07/13/2020 66 >59  mL/min/1.73 Final    Comment:    **[facility] currently reports eGFR in compliance with the current**   recommendations of the [employer]. [facility] will   update reporting as new guidelines are published from the NKF-ASN   Task force.    GFR calc non Af Amer  Date Value Ref Range Status  07/13/2020 58 (L) >59 mL/min/1.73 Final          Passed - Cr in normal range and within 360 days    Creatinine, Ser  Date Value Ref Range Status  07/13/2020 0.96 0.57 - 1.00 mg/dL Final          Passed - HBA1C is between 0 and 7.9 and within 180 days    Hemoglobin A1C  Date Value Ref Range Status  07/13/2020 6.3 (A) 4.0 - 5.6 % Final          Passed - Valid encounter within last 6 months    Recent Outpatient Visits           4 days ago Acute non-recurrent frontal sinusitis   [place], [place], PA-C   2 months ago Type 2 diabetes mellitus with other circulatory complication, without Dejarnette-term current use of insulin (  [facility])   [facility] Jerrol Banana., MD   6 months ago Type 2 diabetes mellitus with other circulatory complication, without Moore-term current use of insulin [facility])   [facility] Jerrol Banana., MD   10 months ago Essential hypertension with goal blood pressure less than 140/90   [facility] Jerrol Banana., MD   11 months ago Allergic rhinitis, unspecified seasonality, unspecified trigger   [facility] Jerrol Banana., MD       Future Appointments             In 1 month Jerrol Banana., MD [facility]               losartan (COZAAR) 50 MG tablet 180 tablet 0    Sig: Take 2 tablets by mouth once daily      Cardiovascular:  Angiotensin Receptor Blockers Failed - 10/01/2020  4:05 PM      Failed - Last BP in normal range    BP Readings from Last 1 Encounters:  07/13/20 (!) 154/65          Passed - Cr in  normal range and within 180 days    Creatinine, Ser  Date Value Ref Range Status  07/13/2020 0.96 0.57 - 1.00 mg/dL Final          Passed - K in normal range and within 180 days    Potassium  Date Value Ref Range Status  07/13/2020 4.2 3.5 - 5.2 mmol/L Final          Passed - Patient is not pregnant      Passed - Valid encounter within last 6 months    Recent Outpatient Visits           4 days ago Acute non-recurrent frontal sinusitis   [facility] Carles Collet M, PA-C   2 months ago Type 2 diabetes mellitus with other circulatory complication, without Belisle-term current use of insulin [facility])   [facility] Jerrol Banana., MD   6 months ago Type 2 diabetes mellitus with other circulatory complication, without Caraher-term current use of insulin [facility])   [facility] Jerrol Banana., MD   10 months ago Essential hypertension with goal blood pressure less than 140/90   [facility] Jerrol Banana., MD   11 months ago Allergic rhinitis, unspecified seasonality, unspecified trigger   [facility] Jerrol Banana., MD       Future Appointments             In 1 month Jerrol Banana., MD [facility]

## 2020-10-05 ENCOUNTER — Telehealth: Payer: Self-pay | Admitting: Infectious Diseases

## 2020-10-05 ENCOUNTER — Ambulatory Visit
Admission: RE | Admit: 2020-10-05 | Discharge: 2020-10-05 | Disposition: A | Discharge status: Home / Self Care | Payer: Medicare Other | Attending: Physician Assistant | Admitting: Physician Assistant

## 2020-10-05 ENCOUNTER — Telehealth: Payer: Self-pay

## 2020-10-05 ENCOUNTER — Other Ambulatory Visit: Payer: Self-pay

## 2020-10-05 ENCOUNTER — Ambulatory Visit
Admission: RE | Admit: 2020-10-05 | Discharge: 2020-10-05 | Disposition: A | Discharge status: Home / Self Care | Payer: Medicare Other | Source: Ambulatory Visit | Attending: Physician Assistant | Admitting: Physician Assistant

## 2020-10-05 DIAGNOSIS — J40 Bronchitis, not specified as acute or chronic: Secondary | ICD-10-CM | POA: Insufficient documentation

## 2020-10-05 DIAGNOSIS — J0110 Acute frontal sinusitis, unspecified: Secondary | ICD-10-CM | POA: Insufficient documentation

## 2020-10-05 NOTE — Telephone Encounter (Signed)
Called to discuss with patient about COVID-19 symptoms and the use of one of the available treatments for those with mild to moderate Covid symptoms and at a high risk of hospitalization.  Pt appears to qualify for outpatient treatment due to co-morbid conditions and/or a member of an at-risk group in accordance with the FDA Emergency Use Authorization.     Symptom onset: unclear  Vaccinated: Pfizer - last dose March 2021  Booster - not documented Qualifiers: multiple medical qualifiers, tier 1 on NIH scale.   Unable to reach pt - *LVM with call back to my work cell.   Has had chronic sinusitus problems for a Ace time. I suspect she is > 7 days in symptoms given documentation. Unlikely we can offer therapy.   Rexene Alberts

## 2020-10-05 NOTE — Telephone Encounter (Signed)
Copied from CRM [id number]. Topic: General - Other >> Oct 05, 2020 10:38 AM Wendy Beck wrote: Reason for CRM: Pt called stating that she tested positive for covid on 10/01/20. She states that she would like to have the infusion. Please advise.

## 2020-10-06 ENCOUNTER — Ambulatory Visit (HOSPITAL_COMMUNITY)
Admission: RE | Admit: 2020-10-06 | Discharge: 2020-10-06 | Disposition: A | Discharge status: Home / Self Care | Payer: Medicare Other | Source: Ambulatory Visit | Attending: Pulmonary Disease | Admitting: Pulmonary Disease

## 2020-10-06 ENCOUNTER — Telehealth: Payer: Self-pay

## 2020-10-06 ENCOUNTER — Other Ambulatory Visit: Payer: Self-pay | Admitting: Infectious Diseases

## 2020-10-06 DIAGNOSIS — U071 COVID-19: Secondary | ICD-10-CM

## 2020-10-06 MED ORDER — FAMOTIDINE IN NACL 20-0.9 MG/50ML-% IV SOLN: 20.0000 mg | Freq: Once | INTRAVENOUS | Status: DC | PRN

## 2020-10-06 MED ORDER — ALBUTEROL SULFATE HFA 108 (90 BASE) MCG/ACT IN AERS: 2.0000 | INHALATION_SPRAY | Freq: Once | RESPIRATORY_TRACT | Status: DC | PRN

## 2020-10-06 MED ORDER — DIPHENHYDRAMINE HCL 50 MG/ML IJ SOLN: 50.0000 mg | Freq: Once | INTRAMUSCULAR | Status: DC | PRN

## 2020-10-06 MED ORDER — SODIUM CHLORIDE 0.9 % IV SOLN
100.0000 mg | Freq: Once | INTRAVENOUS | Status: AC
  Administered 2020-10-06: 100 mg via INTRAVENOUS

## 2020-10-06 MED ORDER — EPINEPHRINE 0.3 MG/0.3ML IJ SOAJ: 0.3000 mg | Freq: Once | INTRAMUSCULAR | Status: DC | PRN

## 2020-10-06 MED ORDER — SODIUM CHLORIDE 0.9 % IV SOLN: INTRAVENOUS | Status: DC | PRN

## 2020-10-06 MED ORDER — METHYLPREDNISOLONE SODIUM SUCC 125 MG IJ SOLR: 125.0000 mg | Freq: Once | INTRAMUSCULAR | Status: DC | PRN

## 2020-10-06 NOTE — Progress Notes (Signed)
I connected by phone with Wendy Beck on 10/06/2020 at 11:07 AM to discuss the potential use of the antiviral REMDESIVIR for acute COVID-19 viral infection in non-hospitalized patients.   This patient is a 75 y.o. female that meets the FDA criteria for Emergency Use Authorization of REMDESIVIR.  Has a (+) direct SARS-CoV-2 viral test result  Has mild or moderate symptoms related to COVID-19  Is within 7 days of symptom onset  Has at least one of the high risk factor(s) for progression to severe COVID-19 and/or hospitalization as defined in NIH Guidelines and EUA.   Specific high risk criteria : Older age (>/= 77 yo), Diabetes and Other high risk medical condition per CDC:  vaccinated but no booster    I have spoken and communicated the following to the patient or parent/caregiver regarding COVID-19 IV Antiviral treatment:  1. FDA has authorized the emergency use for the treatment of mild to moderate COVID-19 in adults and pediatric patients with positive results of SARS-CoV-2 testing who are 22 years of age and older weighing at least 40 kg, and who are at high risk for progressing to severe COVID-19 and/or hospitalization.  2. The significant known and potential risks and benefits in receiving REMDESIVIR in accordance with current NIH treatment guidelines.   3. Information on available alternative treatments and the risks and benefits of those alternatives, including clinical trials that may be accessible to the patient.   4. Patients treated with antiviral therapy should continue to isolate and use infection control measures (e.g., wear mask, isolate, social distance, avoid sharing personal items, clean and disinfect "high touch" surfaces, and frequent handwashing) according to CDC guidelines.   5. The patient or parent/caregiver has the option to accept or refuse REMDESIVIR therapy and has had the opportunity to have all questions addressed prior to consenting for treatment.    After  reviewing this information with the patient, he/she has decided to proceed with the 3 day course of treatment.   Rexene Alberts, NP 10/06/2020  11:07 AM

## 2020-10-06 NOTE — Discharge Instructions (Signed)
10 Things You Can Do to Manage Your COVID-19 Symptoms at Home If you have possible or confirmed COVID-19: 1. Stay home from work and school. And stay away from other public places. If you must go out, avoid using any kind of public transportation, ridesharing, or taxis. 2. Monitor your symptoms carefully. If your symptoms get worse, call your healthcare provider immediately. 3. Get rest and stay hydrated. 4. If you have a medical appointment, call the healthcare provider ahead of time and tell them that you have or may have COVID-19. 5. For medical emergencies, call 911 and notify the dispatch personnel that you have or may have COVID-19. 6. Cover your cough and sneezes with a tissue or use the inside of your elbow. 7. Wash your hands often with soap and water for at least 20 seconds or clean your hands with an alcohol-based hand sanitizer that contains at least 60% alcohol. 8. As much as possible, stay in a specific room and away from other people in your home. Also, you should use a separate bathroom, if available. If you need to be around other people in or outside of the home, wear a mask. 9. Avoid sharing personal items with other people in your household, like dishes, towels, and bedding. 10. Clean all surfaces that are touched often, like counters, tabletops, and doorknobs. Use household cleaning sprays or wipes according to the label instructions. cdc.gov/coronavirus 04/02/2019 This information is not intended to replace advice given to you by your health care provider. Make sure you discuss any questions you have with your health care provider. Document Revised: 09/04/2019 Document Reviewed: 09/04/2019 Elsevier Patient Education  2020 Elsevier Inc. What types of side effects do monoclonal antibody drugs cause?  Common side effects  In general, the more common side effects caused by monoclonal antibody drugs include: . Allergic reactions, such as hives or itching . Flu-like signs and  symptoms, including chills, fatigue, fever, and muscle aches and pains . Nausea, vomiting . Diarrhea . Skin rashes . Low blood pressure   The CDC is recommending patients who receive monoclonal antibody treatments wait at least 90 days before being vaccinated.  Currently, there are no data on the safety and efficacy of mRNA COVID-19 vaccines in persons who received monoclonal antibodies or convalescent plasma as part of COVID-19 treatment. Based on the estimated half-life of such therapies as well as evidence suggesting that reinfection is uncommon in the 90 days after initial infection, vaccination should be deferred for at least 90 days, as a precautionary measure until additional information becomes available, to avoid interference of the antibody treatment with vaccine-induced immune responses. If you have any questions or concerns after the infusion please call the Advanced Practice Provider on call at [phone]. This number is ONLY intended for your use regarding questions or concerns about the infusion post-treatment side-effects.  Please do not provide this number to others for use. For return to work notes please contact your primary care provider.   If someone you know is interested in receiving treatment please have them call the COVID hotline at [phone].   

## 2020-10-06 NOTE — Telephone Encounter (Signed)
Copied from CRM [id number]. Topic: General - Other >> Oct 06, 2020  9:28 AM Wyonia Hough E wrote: Reason for CRM: Pt would like a call to discuss her xray result / Pt also stated she is positive for covid / please advise

## 2020-10-06 NOTE — Progress Notes (Signed)
Patient reviewed Fact Sheet for Patients, Parents, and Caregivers for Emergency Use Authorization (EUA) of Remdesivir for the Treatment of Coronavirus. Patient also reviewed and is agreeable to the estimated cost of treatment. Patient is agreeable to proceed.   

## 2020-10-06 NOTE — Progress Notes (Signed)
  Diagnosis: COVID-19  Physician:  Shan Levans  Procedure: Covid Infusion Clinic Med: remdesivir infusion - Provided patient with remdesivir fact sheet for patients, parents and caregivers prior to infusion.  Complications: No immediate complications noted.  Discharge: Discharged home   William Dalton 10/06/2020

## 2020-10-06 NOTE — Telephone Encounter (Signed)
She had abrupt onset of symptoms on 12/30.   The following was reviewed with her re: Remdesivir treatment.    Hello Wendy Beck,   We have contacted you because you have recently been diagnosed with COVID-19 with active symptoms of less than 7 days in duration and may benefit from a treatment for mild to moderate disease. This treatment has been shown to reduce the risk of hospitalization and decrease the risk for severe symptoms related to COVID-19 in a select group of patients with risk factors / medical conditions we believe put people at a higher risk for this infection.    Remdesivir is an antiviral medicine to treat certain people with COVID-19. Remdesivir was shown in a clinical trial to lower the risk of hospitalizations related to Covid-19. This treatment is now FDA approved but being used off label in the outpatient setting.   Possible side effects of remdesivir are:  . Infusion-related reactions. Infusion-related reactions have been seen during a remdesivir infusion or around the time remdesivir was given. Signs and symptoms of infusion-related reactions may include: low blood pressure, nausea, vomiting, sweating, and shivering. . Increases in levels of liver enzymes. Increases in levels of liver enzymes have been seen in people who have received remdesivir, which may be a sign of inflammation or damage to cells in the liver. Your healthcare provider will do blood tests to check your liver before you receive remdesivir and daily while receiving remdesivir.  . These are not all the possible side effects of remdesivir. Remdesivir is still being studied so it is possible that all of the risks are not known at this time.  . Your insurance will be charged for the medication and an infusion fee. The amount you may owe later varies from insurance to insurance. If you do not have insurance, we can put you in touch with our billing department: [phone]. . If you have been tested outside of a   Facility - you MUST bring a copy of your positive test with you the morning of your appointment. You may take a photo of this and upload to your MyChart portal, have the testing facility fax the result to [phone] or e-mail result to [email or web address].   You have been scheduled to receive the remdesivir therapy at [facility]. You will receive three infusions scheduled on:    Dose 1: 10/06/2020 @ 11:30 am   Dose 2: 10/07/2020 @ 11:30 am   Dose 3: 10/08/2020 @ 11:30 am    The address for the infusion clinic site is:  --GPS address is [address] - the parking is located near [employer] building where you will see COVID19 Infusion feather banner marking the entrance to parking.   (see photos below)            --Enter into the 2nd entrance where the "wave, flag banner" is at the road. Turn into this 2nd entrance and immediately turn left to park in 1 of the 5 parking spots.   --Please stay in your car and call the desk for assistance inside [phone].   --Average time in department for the first infusion is roughly 90 minutes to 2 hours- this includes preparation of the medication, IV start and the required 1 hour monitoring after the infusion. Subsequent infusions will be shorter.    Should you develop worsening shortness of breath, chest pain or severe breathing problems please do not wait for this appointment and go to the Emergency room  for evaluation and treatment. You will undergo another oxygen screen before your infusion to ensure this is the best treatment option for you. There is a chance that the best decision may be to send you to the Emergency Room for evaluation at the time of your appointment.   The day of your visit you should: Marland Kitchen Get plenty of rest the night before and drink plenty of water . Eat a light meal/snack before coming and take your medications as prescribed  . Wear warm, comfortable clothes with a shirt that can roll-up over the  elbow (will need IV start).  . Wear a mask  . Consider bringing some activity to help pass the time   I hope this helps find you feeling better,  Wendy Beck

## 2020-10-07 ENCOUNTER — Ambulatory Visit (HOSPITAL_COMMUNITY)
Admission: RE | Admit: 2020-10-07 | Discharge: 2020-10-07 | Disposition: A | Discharge status: Home / Self Care | Payer: Medicare Other | Source: Ambulatory Visit | Attending: Pulmonary Disease | Admitting: Pulmonary Disease

## 2020-10-07 DIAGNOSIS — U071 COVID-19: Secondary | ICD-10-CM | POA: Insufficient documentation

## 2020-10-07 MED ORDER — DIPHENHYDRAMINE HCL 50 MG/ML IJ SOLN: 50.0000 mg | Freq: Once | INTRAMUSCULAR | Status: DC | PRN

## 2020-10-07 MED ORDER — ONDANSETRON HCL 4 MG/2ML IJ SOLN
4.0000 mg | Freq: Once | INTRAMUSCULAR | Status: AC
  Administered 2020-10-07: 4 mg via INTRAVENOUS
  Filled 2020-10-07: qty 2

## 2020-10-07 MED ORDER — SODIUM CHLORIDE 0.9 % IV SOLN: INTRAVENOUS | Status: DC | PRN

## 2020-10-07 MED ORDER — EPINEPHRINE 0.3 MG/0.3ML IJ SOAJ: 0.3000 mg | Freq: Once | INTRAMUSCULAR | Status: DC | PRN

## 2020-10-07 MED ORDER — SODIUM CHLORIDE 0.9 % IV BOLUS
500.0000 mL | Freq: Once | INTRAVENOUS | Status: AC
  Administered 2020-10-07: 500 mL via INTRAVENOUS

## 2020-10-07 MED ORDER — METHYLPREDNISOLONE SODIUM SUCC 125 MG IJ SOLR: 125.0000 mg | Freq: Once | INTRAMUSCULAR | Status: DC | PRN

## 2020-10-07 MED ORDER — SODIUM CHLORIDE 0.9 % IV SOLN
100.0000 mg | Freq: Once | INTRAVENOUS | Status: AC
  Administered 2020-10-07: 100 mg via INTRAVENOUS

## 2020-10-07 MED ORDER — ALBUTEROL SULFATE HFA 108 (90 BASE) MCG/ACT IN AERS: 2.0000 | INHALATION_SPRAY | Freq: Once | RESPIRATORY_TRACT | Status: DC | PRN

## 2020-10-07 MED ORDER — LORAZEPAM 2 MG/ML IJ SOLN
0.5000 mg | Freq: Once | INTRAMUSCULAR | Status: AC
  Administered 2020-10-07: 0.5 mg via INTRAVENOUS
  Filled 2020-10-07: qty 0.25

## 2020-10-07 MED ORDER — FAMOTIDINE IN NACL 20-0.9 MG/50ML-% IV SOLN: 20.0000 mg | Freq: Once | INTRAVENOUS | Status: DC | PRN

## 2020-10-07 NOTE — Progress Notes (Signed)
Ativan 1.5mg  wasted with Wendelyn Breslow, RN

## 2020-10-07 NOTE — Discharge Instructions (Signed)
10 Things You Can Do to Manage Your COVID-19 Symptoms at Home If you have possible or confirmed COVID-19: 1. Stay home from work and school. And stay away from other public places. If you must go out, avoid using any kind of public transportation, ridesharing, or taxis. 2. Monitor your symptoms carefully. If your symptoms get worse, call your healthcare provider immediately. 3. Get rest and stay hydrated. 4. If you have a medical appointment, call the healthcare provider ahead of time and tell them that you have or may have COVID-19. 5. For medical emergencies, call 911 and notify the dispatch personnel that you have or may have COVID-19. 6. Cover your cough and sneezes with a tissue or use the inside of your elbow. 7. Wash your hands often with soap and water for at least 20 seconds or clean your hands with an alcohol-based hand sanitizer that contains at least 60% alcohol. 8. As much as possible, stay in a specific room and away from other people in your home. Also, you should use a separate bathroom, if available. If you need to be around other people in or outside of the home, wear a mask. 9. Avoid sharing personal items with other people in your household, like dishes, towels, and bedding. 10. Clean all surfaces that are touched often, like counters, tabletops, and doorknobs. Use household cleaning sprays or wipes according to the label instructions. cdc.gov/coronavirus 04/02/2019 This information is not intended to replace advice given to you by your health care provider. Make sure you discuss any questions you have with your health care provider. Document Revised: 09/04/2019 Document Reviewed: 09/04/2019 Elsevier Patient Education  2020 Elsevier Inc.  If you have any questions or concerns after the infusion please call the Advanced Practice Provider on call at [phone]. This number is ONLY intended for your use regarding questions or concerns about the infusion post-treatment  side-effects.  Please do not provide this number to others for use. For return to work notes please contact your primary care provider.   If someone you know is interested in receiving treatment please have them call the COVID hotline at [phone].    

## 2020-10-07 NOTE — Progress Notes (Addendum)
Diagnosis: COVID-19  Physician: Dr. Shan Levans  Procedure: [facility] Med: remdesivir infusion - Provided patient with sotrovimab fact sheet for patients, parents, and caregivers prior to infusion.   Complications: Vomiting x1, treated with zofran, ativan, and IVF, stable to DC home  Discharge: Discharged home

## 2020-10-07 NOTE — Progress Notes (Addendum)
1300 Pt stated she was nauseous. Pre-medicated with zofran.   1330 Pt vomited x1 while remdesivir infusing. RN called Mardella Layman, NP and new orders received for ativan and IVF. WCTM.   1355 Pt states she is feeling better and ready for discharge

## 2020-10-07 NOTE — Telephone Encounter (Signed)
Patient was advised and states that she is doing 3 day infusion. She states she is on day 2, today 10/07/2020. Just a [place]

## 2020-10-07 NOTE — Telephone Encounter (Signed)
Her cxr is normal. There is no pneumonia. I am aware she has COVID, she has passed the interval for treatment. Treatment should be supportive at this point and if she has further issues, follow up with PCP.

## 2020-10-07 NOTE — Progress Notes (Signed)
Patient reviewed Fact Sheet for Patients, Parents, and Caregivers for Emergency Use Authorization (EUA) of remdesivir for the Treatment of Coronavirus. Patient also reviewed and is agreeable to the estimated cost of treatment. Patient is agreeable to proceed.    

## 2020-10-08 ENCOUNTER — Ambulatory Visit (HOSPITAL_COMMUNITY)
Admission: RE | Admit: 2020-10-08 | Discharge: 2020-10-08 | Disposition: A | Discharge status: Home / Self Care | Payer: Medicare Other | Source: Ambulatory Visit | Attending: Pulmonary Disease | Admitting: Pulmonary Disease

## 2020-10-08 DIAGNOSIS — J1282 Pneumonia due to coronavirus disease 2019: Secondary | ICD-10-CM | POA: Insufficient documentation

## 2020-10-08 DIAGNOSIS — U071 COVID-19: Secondary | ICD-10-CM | POA: Insufficient documentation

## 2020-10-08 MED ORDER — SODIUM CHLORIDE 0.9 % IV SOLN
100.0000 mg | Freq: Once | INTRAVENOUS | Status: AC
  Administered 2020-10-08: 100 mg via INTRAVENOUS

## 2020-10-08 MED ORDER — ONDANSETRON HCL 4 MG/2ML IJ SOLN
4.0000 mg | Freq: Once | INTRAMUSCULAR | Status: AC
  Administered 2020-10-08: 4 mg via INTRAVENOUS

## 2020-10-08 MED ORDER — EPINEPHRINE 0.3 MG/0.3ML IJ SOAJ: 0.3000 mg | Freq: Once | INTRAMUSCULAR | Status: DC | PRN

## 2020-10-08 MED ORDER — LORAZEPAM 2 MG/ML IJ SOLN
0.5000 mg | Freq: Once | INTRAMUSCULAR | Status: AC
  Administered 2020-10-08: 0.5 mg via INTRAVENOUS
  Filled 2020-10-08: qty 0.25

## 2020-10-08 MED ORDER — FAMOTIDINE IN NACL 20-0.9 MG/50ML-% IV SOLN: 20.0000 mg | Freq: Once | INTRAVENOUS | Status: DC | PRN

## 2020-10-08 MED ORDER — ALBUTEROL SULFATE HFA 108 (90 BASE) MCG/ACT IN AERS: 2.0000 | INHALATION_SPRAY | Freq: Once | RESPIRATORY_TRACT | Status: DC | PRN

## 2020-10-08 MED ORDER — SODIUM CHLORIDE 0.9 % IV SOLN: INTRAVENOUS | Status: DC | PRN

## 2020-10-08 MED ORDER — DIPHENHYDRAMINE HCL 50 MG/ML IJ SOLN: 50.0000 mg | Freq: Once | INTRAMUSCULAR | Status: DC | PRN

## 2020-10-08 MED ORDER — METHYLPREDNISOLONE SODIUM SUCC 125 MG IJ SOLR: 125.0000 mg | Freq: Once | INTRAMUSCULAR | Status: DC | PRN

## 2020-10-08 NOTE — Discharge Instructions (Signed)
10 Things You Can Do to Manage Your COVID-19 Symptoms at Home If you have possible or confirmed COVID-19: 1. Stay home from work and school. And stay away from other public places. If you must go out, avoid using any kind of public transportation, ridesharing, or taxis. 2. Monitor your symptoms carefully. If your symptoms get worse, call your healthcare provider immediately. 3. Get rest and stay hydrated. 4. If you have a medical appointment, call the healthcare provider ahead of time and tell them that you have or may have COVID-19. 5. For medical emergencies, call 911 and notify the dispatch personnel that you have or may have COVID-19. 6. Cover your cough and sneezes with a tissue or use the inside of your elbow. 7. Wash your hands often with soap and water for at least 20 seconds or clean your hands with an alcohol-based hand sanitizer that contains at least 60% alcohol. 8. As much as possible, stay in a specific room and away from other people in your home. Also, you should use a separate bathroom, if available. If you need to be around other people in or outside of the home, wear a mask. 9. Avoid sharing personal items with other people in your household, like dishes, towels, and bedding. 10. Clean all surfaces that are touched often, like counters, tabletops, and doorknobs. Use household cleaning sprays or wipes according to the label instructions. cdc.gov/coronavirus 04/02/2019 This information is not intended to replace advice given to you by your health care provider. Make sure you discuss any questions you have with your health care provider. Document Revised: 09/04/2019 Document Reviewed: 09/04/2019 Elsevier Patient Education  2020 Elsevier Inc.  If you have any questions or concerns after the infusion please call the Advanced Practice Provider on call at [phone]. This number is ONLY intended for your use regarding questions or concerns about the infusion post-treatment  side-effects.  Please do not provide this number to others for use. For return to work notes please contact your primary care provider.   If someone you know is interested in receiving treatment please have them call the COVID hotline at [phone].    

## 2020-10-08 NOTE — Progress Notes (Signed)
  Diagnosis: COVID-19  Physician: Dr. Asencion Noble  Procedure: [facility] Med: remdesivir infusion - Provided patient with remdesivir fact sheet for patients, parents and caregivers prior to infusion.  Complications: No immediate complications noted.  However; given emesis with therapy yesterday, premedicated pt with Zofran and Ativan.  Discharge: Discharged home   Monna Fam 10/08/2020

## 2020-10-08 NOTE — Progress Notes (Signed)
1.5mg  ativan wasted with Janine Ores, RN

## 2020-10-13 NOTE — Progress Notes (Signed)
Pt reviewed or was advised of her results and had called the office back to let know she was going to do the infusion treatments.

## 2020-11-10 NOTE — Addendum Note (Signed)
Encounter addended by: Paul Dykes, RN on: 11/10/2020 6:31 PM  Actions taken: Charge Capture section accepted

## 2020-11-17 ENCOUNTER — Encounter: Payer: Self-pay | Admitting: Family Medicine

## 2020-11-17 ENCOUNTER — Ambulatory Visit (INDEPENDENT_AMBULATORY_CARE_PROVIDER_SITE_OTHER): Payer: Medicare Other | Admitting: Family Medicine

## 2020-11-17 ENCOUNTER — Other Ambulatory Visit: Payer: Self-pay

## 2020-11-17 VITALS — BP 145/57 | HR 64 | Temp 97.6°F | Resp 16 | Wt 179.0 lb

## 2020-11-17 DIAGNOSIS — G47419 Narcolepsy without cataplexy: Secondary | ICD-10-CM | POA: Diagnosis not present

## 2020-11-17 DIAGNOSIS — E1159 Type 2 diabetes mellitus with other circulatory complications: Secondary | ICD-10-CM | POA: Diagnosis not present

## 2020-11-17 DIAGNOSIS — E039 Hypothyroidism, unspecified: Secondary | ICD-10-CM | POA: Diagnosis not present

## 2020-11-17 DIAGNOSIS — I10 Essential (primary) hypertension: Secondary | ICD-10-CM | POA: Diagnosis not present

## 2020-11-17 DIAGNOSIS — M79671 Pain in right foot: Secondary | ICD-10-CM | POA: Diagnosis not present

## 2020-11-17 DIAGNOSIS — E785 Hyperlipidemia, unspecified: Secondary | ICD-10-CM | POA: Diagnosis not present

## 2020-11-17 LAB — POCT GLYCOSYLATED HEMOGLOBIN (HGB A1C)
Est. average glucose Bld gHb Est-mCnc: 140
Hemoglobin A1C: 6.5 % — AB (ref 4.0–5.6)

## 2020-11-17 MED ORDER — BISOPROLOL-HYDROCHLOROTHIAZIDE 5-6.25 MG PO TABS: 1.0000 | ORAL_TABLET | Freq: Every day | ORAL | 3 refills | Status: DC

## 2020-11-17 MED ORDER — METFORMIN HCL ER 500 MG PO TB24: ORAL_TABLET | ORAL | 3 refills | Status: DC

## 2020-11-17 MED ORDER — LEVOTHYROXINE SODIUM 100 MCG PO TABS: 100.0000 ug | ORAL_TABLET | Freq: Every day | ORAL | 3 refills | Status: DC

## 2020-11-17 MED ORDER — LOSARTAN POTASSIUM 50 MG PO TABS: 100.0000 mg | ORAL_TABLET | Freq: Every day | ORAL | 3 refills | Status: DC

## 2020-11-17 MED ORDER — ATORVASTATIN CALCIUM 40 MG PO TABS: 40.0000 mg | ORAL_TABLET | Freq: Every day | ORAL | 3 refills | Status: DC

## 2020-11-17 MED ORDER — MODAFINIL 200 MG PO TABS: 200.0000 mg | ORAL_TABLET | Freq: Every day | ORAL | 1 refills | Status: DC

## 2020-11-17 NOTE — Patient Instructions (Addendum)
Please try over the counter Rogaine for hair loss Get [employer] blood pressure monitor  Try over the counter Mucinex twice a day for post nasal drip   Hypertension, Adult High blood pressure (hypertension) is when the force of blood pumping through the arteries is too strong. The arteries are the blood vessels that carry blood from the heart throughout the body. Hypertension forces the heart to work harder to pump blood and may cause arteries to become narrow or stiff. Untreated or uncontrolled hypertension can cause a heart attack, heart failure, a stroke, kidney disease, and other problems. A blood pressure reading consists of a higher number over a lower number. Ideally, your blood pressure should be below 120/80. The first ("top") number is called the systolic pressure. It is a measure of the pressure in your arteries as your heart beats. The second ("bottom") number is called the diastolic pressure. It is a measure of the pressure in your arteries as the heart relaxes. What are the causes? The exact cause of this condition is not known. There are some conditions that result in or are related to high blood pressure. What increases the risk? Some risk factors for high blood pressure are under your control. The following factors may make you more likely to develop this condition:  Smoking.  Having type 2 diabetes mellitus, high cholesterol, or both.  Not getting enough exercise or physical activity.  Being overweight.  Having too much fat, sugar, calories, or salt (sodium) in your diet.  Drinking too much alcohol. Some risk factors for high blood pressure may be difficult or impossible to change. Some of these factors include:  Having chronic kidney disease.  Having a family history of high blood pressure.  Age. Risk increases with age.  Race. You may be at higher risk if you are African American.  Gender. Men are at higher risk than women before age 3. After age 37, women are at  higher risk than men.  Having obstructive sleep apnea.  Stress. What are the signs or symptoms? High blood pressure may not cause symptoms. Very high blood pressure (hypertensive crisis) may cause:  Headache.  Anxiety.  Shortness of breath.  Nosebleed.  Nausea and vomiting.  Vision changes.  Severe chest pain.  Seizures. How is this diagnosed? This condition is diagnosed by measuring your blood pressure while you are seated, with your arm resting on a flat surface, your legs uncrossed, and your feet flat on the floor. The cuff of the blood pressure monitor will be placed directly against the skin of your upper arm at the level of your heart. It should be measured at least twice using the same arm. Certain conditions can cause a difference in blood pressure between your right and left arms. Certain factors can cause blood pressure readings to be lower or higher than normal for a short period of time:  When your blood pressure is higher when you are in a health care provider's office than when you are at home, this is called white coat hypertension. Most people with this condition do not need medicines.  When your blood pressure is higher at home than when you are in a health care provider's office, this is called masked hypertension. Most people with this condition may need medicines to control blood pressure. If you have a high blood pressure reading during one visit or you have normal blood pressure with other risk factors, you may be asked to:  Return on a different day to have  your blood pressure checked again.  Monitor your blood pressure at home for 1 week or longer. If you are diagnosed with hypertension, you may have other blood or imaging tests to help your health care provider understand your overall risk for other conditions. How is this treated? This condition is treated by making healthy lifestyle changes, such as eating healthy foods, exercising more, and reducing  your alcohol intake. Your health care provider may prescribe medicine if lifestyle changes are not enough to get your blood pressure under control, and if:  Your systolic blood pressure is above 130.  Your diastolic blood pressure is above 80. Your personal target blood pressure may vary depending on your medical conditions, your age, and other factors. Follow these instructions at home: Eating and drinking  Eat a diet that is high in fiber and potassium, and low in sodium, added sugar, and fat. An example eating plan is called the DASH (Dietary Approaches to Stop Hypertension) diet. To eat this way: ? Eat plenty of fresh fruits and vegetables. Try to fill one half of your plate at each meal with fruits and vegetables. ? Eat whole grains, such as whole-wheat pasta, brown rice, or whole-grain bread. Fill about one fourth of your plate with whole grains. ? Eat or drink low-fat dairy products, such as skim milk or low-fat yogurt. ? Avoid fatty cuts of meat, processed or cured meats, and poultry with skin. Fill about one fourth of your plate with lean proteins, such as fish, chicken without skin, beans, eggs, or tofu. ? Avoid pre-made and processed foods. These tend to be higher in sodium, added sugar, and fat.  Reduce your daily sodium intake. Most people with hypertension should eat less than 1,500 mg of sodium a day.  Do not drink alcohol if: ? Your health care provider tells you not to drink. ? You are pregnant, may be pregnant, or are planning to become pregnant.  If you drink alcohol: ? Limit how much you use to:  0-1 drink a day for women.  0-2 drinks a day for men. ? Be aware of how much alcohol is in your drink. In the [place] one drink equals one 12 oz bottle of beer (355 mL), one 5 oz glass of wine (148 mL), or one 1 oz glass of hard liquor (44 mL).   Lifestyle  Work with your health care provider to maintain a healthy body weight or to lose weight. Ask what an ideal weight is  for you.  Get at least 30 minutes of exercise most days of the week. Activities may include walking, swimming, or biking.  Include exercise to strengthen your muscles (resistance exercise), such as Pilates or lifting weights, as part of your weekly exercise routine. Try to do these types of exercises for 30 minutes at least 3 days a week.  Do not use any products that contain nicotine or tobacco, such as cigarettes, e-cigarettes, and chewing tobacco. If you need help quitting, ask your health care provider.  Monitor your blood pressure at home as told by your health care provider.  Keep all follow-up visits as told by your health care provider. This is important.   Medicines  Take over-the-counter and prescription medicines only as told by your health care provider. Follow directions carefully. Blood pressure medicines must be taken as prescribed.  Do not skip doses of blood pressure medicine. Doing this puts you at risk for problems and can make the medicine less effective.  Ask your health care  provider about side effects or reactions to medicines that you should watch for. Contact a health care provider if you:  Think you are having a reaction to a medicine you are taking.  Have headaches that keep coming back (recurring).  Feel dizzy.  Have swelling in your ankles.  Have trouble with your vision. Get help right away if you:  Develop a severe headache or confusion.  Have unusual weakness or numbness.  Feel faint.  Have severe pain in your chest or abdomen.  Vomit repeatedly.  Have trouble breathing. Summary  Hypertension is when the force of blood pumping through your arteries is too strong. If this condition is not controlled, it may put you at risk for serious complications.  Your personal target blood pressure may vary depending on your medical conditions, your age, and other factors. For most people, a normal blood pressure is less than 120/80.  Hypertension is  treated with lifestyle changes, medicines, or a combination of both. Lifestyle changes include losing weight, eating a healthy, low-sodium diet, exercising more, and limiting alcohol. This information is not intended to replace advice given to you by your health care provider. Make sure you discuss any questions you have with your health care provider. Document Revised: 05/29/2018 Document Reviewed: 05/29/2018 Elsevier Patient Education  2021 [place]. Diabetes Mellitus and Nutrition, Adult When you have diabetes, or diabetes mellitus, it is very important to have healthy eating habits because your blood sugar (glucose) levels are greatly affected by what you eat and drink. Eating healthy foods in the right amounts, at about the same times every day, can help you:  Control your blood glucose.  Lower your risk of heart disease.  Improve your blood pressure.  Reach or maintain a healthy weight. What can affect my meal plan? Every person with diabetes is different, and each person has different needs for a meal plan. Your health care provider may recommend that you work with a dietitian to make a meal plan that is best for you. Your meal plan may vary depending on factors such as:  The calories you need.  The medicines you take.  Your weight.  Your blood glucose, blood pressure, and cholesterol levels.  Your activity level.  Other health conditions you have, such as heart or kidney disease. How do carbohydrates affect me? Carbohydrates, also called carbs, affect your blood glucose level more than any other type of food. Eating carbs naturally raises the amount of glucose in your blood. Carb counting is a method for keeping track of how many carbs you eat. Counting carbs is important to keep your blood glucose at a healthy level, especially if you use insulin or take certain oral diabetes medicines. It is important to know how many carbs you can safely have in each meal. This is  different for every person. Your dietitian can help you calculate how many carbs you should have at each meal and for each snack. How does alcohol affect me? Alcohol can cause a sudden decrease in blood glucose (hypoglycemia), especially if you use insulin or take certain oral diabetes medicines. Hypoglycemia can be a life-threatening condition. Symptoms of hypoglycemia, such as sleepiness, dizziness, and confusion, are similar to symptoms of having too much alcohol.  Do not drink alcohol if: ? Your health care provider tells you not to drink. ? You are pregnant, may be pregnant, or are planning to become pregnant.  If you drink alcohol: ? Do not drink on an empty stomach. ? Limit how  much you use to:  0-1 drink a day for women.  0-2 drinks a day for men. ? Be aware of how much alcohol is in your drink. In the [place] one drink equals one 12 oz bottle of beer (355 mL), one 5 oz glass of wine (148 mL), or one 1 oz glass of hard liquor (44 mL). ? Keep yourself hydrated with water, diet soda, or unsweetened iced tea.  Keep in mind that regular soda, juice, and other mixers may contain a lot of sugar and must be counted as carbs. What are tips for following this plan? Reading food labels  Start by checking the serving size on the "Nutrition Facts" label of packaged foods and drinks. The amount of calories, carbs, fats, and other nutrients listed on the label is based on one serving of the item. Many items contain more than one serving per package.  Check the total grams (g) of carbs in one serving. You can calculate the number of servings of carbs in one serving by dividing the total carbs by 15. For example, if a food has 30 g of total carbs per serving, it would be equal to 2 servings of carbs.  Check the number of grams (g) of saturated fats and trans fats in one serving. Choose foods that have a low amount or none of these fats.  Check the number of milligrams (mg) of salt (sodium) in one  serving. Most people should limit total sodium intake to less than 2,300 mg per day.  Always check the nutrition information of foods labeled as "low-fat" or "nonfat." These foods may be higher in added sugar or refined carbs and should be avoided.  Talk to your dietitian to identify your daily goals for nutrients listed on the label. Shopping  Avoid buying canned, pre-made, or processed foods. These foods tend to be high in fat, sodium, and added sugar.  Shop around the outside edge of the grocery store. This is where you will most often find fresh fruits and vegetables, bulk grains, fresh meats, and fresh dairy. Cooking  Use low-heat cooking methods, such as baking, instead of high-heat cooking methods like deep frying.  Cook using healthy oils, such as olive, canola, or sunflower oil.  Avoid cooking with butter, cream, or high-fat meats. Meal planning  Eat meals and snacks regularly, preferably at the same times every day. Avoid going [place] periods of time without eating.  Eat foods that are high in fiber, such as fresh fruits, vegetables, beans, and whole grains. Talk with your dietitian about how many servings of carbs you can eat at each meal.  Eat 4-6 oz (112-168 g) of lean protein each day, such as lean meat, chicken, fish, eggs, or tofu. One ounce (oz) of lean protein is equal to: ? 1 oz (28 g) of meat, chicken, or fish. ? 1 egg. ?  cup (62 g) of tofu.  Eat some foods each day that contain healthy fats, such as avocado, nuts, seeds, and fish.   What foods should I eat? Fruits Berries. Apples. Oranges. Peaches. Apricots. Plums. Grapes. Mango. Papaya. Pomegranate. Kiwi. Cherries. Vegetables Lettuce. Spinach. Leafy greens, including kale, chard, collard greens, and mustard greens. Beets. Cauliflower. Cabbage. Broccoli. Carrots. Green beans. Tomatoes. Peppers. Onions. Cucumbers. Brussels sprouts. Grains Whole grains, such as whole-wheat or whole-grain bread, crackers,  tortillas, cereal, and pasta. Unsweetened oatmeal. Quinoa. Brown or wild rice. Meats and other proteins Seafood. Poultry without skin. Lean cuts of poultry and beef. Tofu. Nuts. Seeds.  Dairy Low-fat or fat-free dairy products such as milk, yogurt, and cheese. The items listed above may not be a complete list of foods and beverages you can eat. Contact a dietitian for more information. What foods should I avoid? Fruits Fruits canned with syrup. Vegetables Canned vegetables. Frozen vegetables with butter or cream sauce. Grains Refined white flour and flour products such as bread, pasta, snack foods, and cereals. Avoid all processed foods. Meats and other proteins Fatty cuts of meat. Poultry with skin. Breaded or fried meats. Processed meat. Avoid saturated fats. Dairy Full-fat yogurt, cheese, or milk. Beverages Sweetened drinks, such as soda or iced tea. The items listed above may not be a complete list of foods and beverages you should avoid. Contact a dietitian for more information. Questions to ask a health care provider  Do I need to meet with a diabetes educator?  Do I need to meet with a dietitian?  What number can I call if I have questions?  When are the best times to check my blood glucose? Where to find more information:  American Diabetes Association: diabetes.org  Academy of Nutrition and Dietetics: www.eatright.[employer] of Diabetes and Digestive and Kidney Diseases: [employer]  Association of Diabetes Care and Education Specialists: www.diabeteseducator.org Summary  It is important to have healthy eating habits because your blood sugar (glucose) levels are greatly affected by what you eat and drink.  A healthy meal plan will help you control your blood glucose and maintain a healthy lifestyle.  Your health care provider may recommend that you work with a dietitian to make a meal plan that is best for you.  Keep in mind that carbohydrates  (carbs) and alcohol have immediate effects on your blood glucose levels. It is important to count carbs and to use alcohol carefully. This information is not intended to replace advice given to you by your health care provider. Make sure you discuss any questions you have with your health care provider. Document Revised: 08/26/2019 Document Reviewed: 08/26/2019 Elsevier Patient Education  2021 [employer].

## 2020-11-17 NOTE — Progress Notes (Signed)
.   Established patient visit   Patient: Wendy Beck   DOB: December 07, 1945   75 y.o. Female  MRN: [id number] Visit Date: 11/17/2020  Today's healthcare provider: Wilhemena Durie, MD   Chief Complaint  Patient presents with  . Diabetes  . Hypertension   Subjective    HPI  Patient has multiple small complaints. She  complains of recent hair loss she feels coincides with her Covid infection of the end of last year. Also still complains of right foot pain and would like to possibly see podiatry. Since Covid she is continue to have some postnasal drainage that bothers her. Cough fevers or shortness of breath. Diabetes Mellitus Type II, follow-up  Lab Results  Component Value Date   HGBA1C 6.5 (A) 11/17/2020   HGBA1C 6.3 (A) 07/13/2020   HGBA1C 8.5 (A) 03/10/2020   Last seen for diabetes 4 months ago.  Management since then includes continuing the same treatment. She reports good compliance with treatment. She is not having side effects. none  Home blood sugar records: not being checked  Episodes of hypoglycemia? No none   Current insulin regiment: none Most Recent Eye Exam: 08/17/2020  --------------------------------------------------------------------  Hypertension, follow-up  BP Readings from Last 3 Encounters:  11/17/20 (!) 145/57  10/08/20 (!) 138/59  10/07/20 (!) 132/57   Wt Readings from Last 3 Encounters:  11/17/20 179 lb (81.2 kg)  07/13/20 177 lb (80.3 kg)  03/10/20 189 lb 6.4 oz (85.9 kg)     She was last seen for hypertension 4 months ago.  BP at that visit was 154/65. Management since that visit includes; on bisoprolol-hydrochlorothiazide and losartan. She reports excellent compliance with treatment. She is not having side effects.  She is not exercising. She is adherent to low salt diet.   Outside blood pressures are not being checked.  She does not smoke.  Use of agents associated with hypertension: none.    --------------------------------------------------------------------  Patient Active Problem List   Diagnosis Date Noted  . Posterior vitreous detachment of both eyes 04/12/2016  . Depression 11/03/2015  . Resting tremor 11/03/2015  . Essential tremor 05/25/2015  . Astigmatism 03/19/2014  . DOE (dyspnea on exertion) 10/08/2013  . Palpitations 10/08/2013  . Basal cell carcinoma of left temple region 02/26/2013  . Venous insufficiency 09/02/2012  . Nuclear sclerotic cataract of both eyes 07/16/2012  . Chronic foot pain 12/12/2011  . Obesity (BMI 30.0-34.9) 12/12/2011  . Chronic anxiety 08/14/2011  . Chronic back pain 08/14/2011  . Dyslipidemia, goal LDL below 100 08/14/2011  . Essential hypertension with goal blood pressure less than 140/90 08/14/2011  . Hypothyroidism 08/14/2011  . Mild mitral insufficiency 08/14/2011  . Narcolepsy 08/14/2011  . Prediabetes 08/14/2011  . Vitamin D deficiency 08/14/2011   Social History   Tobacco Use  . Smoking status: Never Smoker  . Smokeless tobacco: Never Used  Vaping Use  . Vaping Use: Never used  Substance Use Topics  . Alcohol use: No  . Drug use: No   Allergies  Allergen Reactions  . Niacin And Related Hives    flushing  . Ezetimibe Other (See Comments)    Increased lft    . Statins Other (See Comments)    Muscle aches Increased lft        Medications: Outpatient Medications Prior to Visit  Medication Sig  . atorvastatin (LIPITOR) 40 MG tablet Take 1 tablet (40 mg total) by mouth daily.  . Azelastine HCl 137 MCG/SPRAY SOLN one spray by  Both Nostrils route daily.  Marland Kitchen b complex vitamins tablet Take 1 tablet by mouth daily with lunch.  . bisoprolol-hydrochlorothiazide (ZIAC) 5-6.25 MG tablet Take 1 tablet by mouth daily.  . cholecalciferol (VITAMIN D) 1000 units tablet Take 2,000 Units by mouth daily with lunch.   . Cholecalciferol (VITAMIN D3) 5000 units CAPS Take by mouth daily.   . Coenzyme Q10 300 MG WAFR Take  300 mg by mouth daily with lunch.  . Cyanocobalamin (VITAMIN B-12) 5000 MCG SUBL Place under the tongue daily.  . diclofenac Sodium (VOLTAREN) 1 % GEL Apply 4 g topically 4 (four) times daily. As needed  . fexofenadine-pseudoephedrine (ALLEGRA-D) 60-120 MG 12 hr tablet Take 1 tablet by mouth 2 (two) times daily.   . Glucosamine Sulfate (DONA PO) Take 2 tablets by mouth daily with lunch.   . ketoconazole (NIZORAL) 2 % shampoo Apply 1 application topically as directed. Apply 2-3 times weekly. Let sit for 10 minutes before rinsing.  Marland Kitchen levothyroxine (SYNTHROID) 100 MCG tablet Take 1 tablet (100 mcg total) by mouth daily before breakfast.  . losartan (COZAAR) 50 MG tablet Take 2 tablets by mouth once daily  . metFORMIN (GLUCOPHAGE-XR) 500 MG 24 hr tablet Take 2 tablets by mouth once daily  . modafinil (PROVIGIL) 200 MG tablet Take 1 tablet by mouth once daily  . mometasone (ELOCON) 0.1 % cream Apply 1 application topically 2 (two) times daily.  . mometasone (ELOCON) 0.1 % cream Apply 1 application topically daily as needed (Rash).  . mometasone (ELOCON) 0.1 % lotion Apply topically daily.  . Multiple Vitamins-Minerals (HAIR VITAMINS PO) Take 2 tablets by mouth daily with lunch.   . Omega-3 Fatty Acids (FISH OIL ULTRA) 1400 MG CAPS Take 4,200 mg by mouth daily at 2 PM.  . omeprazole (PRILOSEC) 20 MG capsule Take 20 mg by mouth daily. As needed  . Pitavastatin Calcium 4 MG TABS Take 4 mg by mouth daily. Patient will hold for elevated lft labwork  . potassium chloride (KLOR-CON) 10 MEQ tablet Take 10 mEq by mouth daily. As needed  . [DISCONTINUED] famotidine (PEPCID) 20 MG tablet Take 1 tablet (20 mg total) by mouth daily. (Patient not taking: Reported on 03/31/2020)  . [DISCONTINUED] FLUoxetine (PROZAC) 20 MG capsule Take 20 mg by mouth daily.   . [DISCONTINUED] HYDROcodone-acetaminophen (NORCO) 5-325 MG tablet Take 1-2 tablets by mouth every 4 (four) hours as needed for moderate pain. (Patient not  taking: Reported on 09/30/2020)  . [DISCONTINUED] predniSONE (DELTASONE) 10 MG tablet Take 6 pills on day 1, 5 pills on day 2 and so on until complete.  . [DISCONTINUED] sucralfate (CARAFATE) 1 g tablet Take 1 tablet (1 g total) by mouth 4 (four) times daily. (Patient not taking: Reported on 09/30/2020)   No facility-administered medications prior to visit.    Review of Systems  Constitutional: Negative for appetite change, chills, fatigue and fever.  Respiratory: Negative for chest tightness and shortness of breath.   Cardiovascular: Negative for chest pain and palpitations.  Gastrointestinal: Negative for abdominal pain, nausea and vomiting.  Neurological: Negative for dizziness and weakness.    Last CBC Lab Results  Component Value Date   WBC 6.6 11/11/2019   HGB 14.6 11/11/2019   HCT 43.9 11/11/2019   MCV 90 11/11/2019   MCH 30.0 11/11/2019   RDW 11.9 11/11/2019   PLT 207 93/81/0175   Last metabolic panel Lab Results  Component Value Date   GLUCOSE 127 (H) 07/13/2020   NA 140 07/13/2020  K 4.2 07/13/2020   CL 103 07/13/2020   CO2 25 07/13/2020   BUN 22 07/13/2020   CREATININE 0.96 07/13/2020   GFRNONAA 58 (L) 07/13/2020   GFRAA 66 07/13/2020   CALCIUM 9.9 07/13/2020   PROT 6.7 07/13/2020   ALBUMIN 4.4 07/13/2020   LABGLOB 2.3 07/13/2020   AGRATIO 1.9 07/13/2020   BILITOT 0.6 07/13/2020   ALKPHOS 108 07/13/2020   AST 28 07/13/2020   ALT 42 (H) 07/13/2020   ANIONGAP 9 03/31/2019   Last lipids Lab Results  Component Value Date   CHOL 173 07/13/2020   HDL 45 07/13/2020   LDLCALC 110 (H) 07/13/2020   TRIG 100 07/13/2020   CHOLHDL 3.8 07/13/2020   Last hemoglobin A1c Lab Results  Component Value Date   HGBA1C 6.5 (A) 11/17/2020   Last thyroid functions Lab Results  Component Value Date   TSH 3.140 11/11/2019       Objective    BP (!) 145/57 (BP Location: Left Arm, Patient Position: Sitting, Cuff Size: Normal)   Pulse 64   Temp 97.6 F (36.4 C)  (Oral)   Resp 16   Wt 179 lb (81.2 kg)   SpO2 97%   BMI 31.71 kg/m  BP Readings from Last 3 Encounters:  11/17/20 (!) 145/57  10/08/20 (!) 138/59  10/07/20 (!) 132/57   Wt Readings from Last 3 Encounters:  11/17/20 179 lb (81.2 kg)  07/13/20 177 lb (80.3 kg)  03/10/20 189 lb 6.4 oz (85.9 kg)       Physical Exam Vitals reviewed.  Constitutional:      Appearance: Normal appearance.  HENT:     Head: Normocephalic and atraumatic.     Right Ear: External ear normal.     Left Ear: External ear normal.  Eyes:     General: No scleral icterus.    Conjunctiva/sclera: Conjunctivae normal.  Cardiovascular:     Rate and Rhythm: Normal rate and regular rhythm.     Pulses: Normal pulses.     Heart sounds: Normal heart sounds.  Pulmonary:     Effort: Pulmonary effort is normal.     Breath sounds: Normal breath sounds.  Abdominal:     Palpations: Abdomen is soft.  Musculoskeletal:     Right lower leg: No edema.     Left lower leg: No edema.  Skin:    General: Skin is warm and dry.  Neurological:     General: No focal deficit present.     Mental Status: She is alert and oriented to person, place, and time.  Psychiatric:        Mood and Affect: Mood normal.        Behavior: Behavior normal.        Thought Content: Thought content normal.        Judgment: Judgment normal.       Results for orders placed or performed in visit on 11/17/20  POCT glycosylated hemoglobin (Hb A1C)  Result Value Ref Range   Hemoglobin A1C 6.5 (A) 4.0 - 5.6 %   Est. average glucose Bld gHb Est-mCnc 140     Assessment & Plan     1. Type 2 diabetes mellitus with other circulatory complication, without Nee-term current use of insulin (HCC) A1c is 6.5 today. Good diabetic control. - POCT glycosylated hemoglobin (Hb A1C) - metFORMIN (GLUCOPHAGE-XR) 500 MG 24 hr tablet; Take 2 tablets by mouth once daily  Dispense: 180 tablet; Refill: 3  2. Essential hypertension with goal blood pressure  less than  140/90 Get blood pressure cuff for home use. Bring in readings on next visit. - losartan (COZAAR) 50 MG tablet; Take 2 tablets (100 mg total) by mouth daily.  Dispense: 180 tablet; Refill: 3 - bisoprolol-hydrochlorothiazide (ZIAC) 5-6.25 MG tablet; Take 1 tablet by mouth daily.  Dispense: 90 tablet; Refill: 3 - CBC with Differential/Platelet - Comprehensive metabolic panel  3. Hypothyroidism, unspecified type  - levothyroxine (SYNTHROID) 100 MCG tablet; Take 1 tablet (100 mcg total) by mouth daily before breakfast.  Dispense: 90 tablet; Refill: 3 - TSH  4. Hyperlipidemia, unspecified hyperlipidemia type  - atorvastatin (LIPITOR) 40 MG tablet; Take 1 tablet (40 mg total) by mouth daily.  Dispense: 90 tablet; Refill: 3 - Lipid Panel With LDL/HDL Ratio  5. Primary narcolepsy without cataplexy Diagnosed by neurology. Continue Provigil. - modafinil (PROVIGIL) 200 MG tablet; Take 1 tablet (200 mg total) by mouth daily.  Dispense: 90 tablet; Refill: 1  6. Right foot pain  metatarsalgia versus arthritis versus callus. - Ambulatory referral to Podiatry 7. Alopecia She may be thinning out a little bit on top. Try woman's Rogaine may need referral to dermatology.  No follow-ups on file.      I, Wilhemena Durie, MD, have reviewed all documentation for this visit. The documentation on 11/20/20 for the exam, diagnosis, procedures, and orders are all accurate and complete.    Granvil Djordjevic Cranford Mon, MD  [facility] [phone] (phone) [phone] (fax)  [place]

## 2020-11-18 LAB — COMPREHENSIVE METABOLIC PANEL WITH GFR
ALT: 45 [IU]/L — ABNORMAL HIGH (ref 0–32)
AST: 25 [IU]/L (ref 0–40)
Albumin/Globulin Ratio: 1.6 (ref 1.2–2.2)
Albumin: 4.2 g/dL (ref 3.7–4.7)
Alkaline Phosphatase: 98 [IU]/L (ref 44–121)
BUN/Creatinine Ratio: 16 (ref 12–28)
BUN: 14 mg/dL (ref 8–27)
Bilirubin Total: 0.6 mg/dL (ref 0.0–1.2)
CO2: 23 mmol/L (ref 20–29)
Calcium: 9.8 mg/dL (ref 8.7–10.3)
Chloride: 104 mmol/L (ref 96–106)
Creatinine, Ser: 0.85 mg/dL (ref 0.57–1.00)
GFR calc Af Amer: 77 mL/min/{1.73_m2}
GFR calc non Af Amer: 67 mL/min/{1.73_m2}
Globulin, Total: 2.6 g/dL (ref 1.5–4.5)
Glucose: 129 mg/dL — ABNORMAL HIGH (ref 65–99)
Potassium: 4.4 mmol/L (ref 3.5–5.2)
Sodium: 142 mmol/L (ref 134–144)
Total Protein: 6.8 g/dL (ref 6.0–8.5)

## 2020-11-18 LAB — CBC WITH DIFFERENTIAL/PLATELET
Basophils Absolute: 0 10*3/uL (ref 0.0–0.2)
Basos: 1 %
EOS (ABSOLUTE): 0.5 10*3/uL — ABNORMAL HIGH (ref 0.0–0.4)
Eos: 7 %
Hematocrit: 42.3 % (ref 34.0–46.6)
Hemoglobin: 14.4 g/dL (ref 11.1–15.9)
Immature Grans (Abs): 0 10*3/uL (ref 0.0–0.1)
Immature Granulocytes: 0 %
Lymphocytes Absolute: 2 10*3/uL (ref 0.7–3.1)
Lymphs: 30 %
MCH: 30.4 pg (ref 26.6–33.0)
MCHC: 34 g/dL (ref 31.5–35.7)
MCV: 89 fL (ref 79–97)
Monocytes Absolute: 0.5 10*3/uL (ref 0.1–0.9)
Monocytes: 7 %
Neutrophils Absolute: 3.7 10*3/uL (ref 1.4–7.0)
Neutrophils: 55 %
Platelets: 204 10*3/uL (ref 150–450)
RBC: 4.73 x10E6/uL (ref 3.77–5.28)
RDW: 12.3 % (ref 11.7–15.4)
WBC: 6.7 10*3/uL (ref 3.4–10.8)

## 2020-11-18 LAB — LIPID PANEL WITH LDL/HDL RATIO
Cholesterol, Total: 205 mg/dL — ABNORMAL HIGH (ref 100–199)
HDL: 53 mg/dL
LDL Chol Calc (NIH): 131 mg/dL — ABNORMAL HIGH (ref 0–99)
LDL/HDL Ratio: 2.5 ratio (ref 0.0–3.2)
Triglycerides: 119 mg/dL (ref 0–149)
VLDL Cholesterol Cal: 21 mg/dL (ref 5–40)

## 2020-11-18 LAB — TSH: TSH: 1.68 u[IU]/mL (ref 0.450–4.500)

## 2020-11-22 ENCOUNTER — Other Ambulatory Visit: Payer: Self-pay

## 2020-11-22 ENCOUNTER — Ambulatory Visit (INDEPENDENT_AMBULATORY_CARE_PROVIDER_SITE_OTHER): Payer: Medicare Other | Admitting: Dermatology

## 2020-11-22 ENCOUNTER — Encounter: Payer: Self-pay | Admitting: Dermatology

## 2020-11-22 DIAGNOSIS — L821 Other seborrheic keratosis: Secondary | ICD-10-CM | POA: Diagnosis not present

## 2020-11-22 DIAGNOSIS — D1800 Hemangioma unspecified site: Secondary | ICD-10-CM

## 2020-11-22 DIAGNOSIS — L309 Dermatitis, unspecified: Secondary | ICD-10-CM

## 2020-11-22 DIAGNOSIS — L853 Xerosis cutis: Secondary | ICD-10-CM | POA: Diagnosis not present

## 2020-11-22 DIAGNOSIS — L578 Other skin changes due to chronic exposure to nonionizing radiation: Secondary | ICD-10-CM | POA: Diagnosis not present

## 2020-11-22 DIAGNOSIS — L820 Inflamed seborrheic keratosis: Secondary | ICD-10-CM

## 2020-11-22 DIAGNOSIS — L304 Erythema intertrigo: Secondary | ICD-10-CM

## 2020-11-22 DIAGNOSIS — L814 Other melanin hyperpigmentation: Secondary | ICD-10-CM | POA: Diagnosis not present

## 2020-11-22 DIAGNOSIS — Z1283 Encounter for screening for malignant neoplasm of skin: Secondary | ICD-10-CM

## 2020-11-22 DIAGNOSIS — D229 Melanocytic nevi, unspecified: Secondary | ICD-10-CM

## 2020-11-22 MED ORDER — KETOCONAZOLE 2 % EX CREA: TOPICAL_CREAM | CUTANEOUS | 2 refills | Status: DC

## 2020-11-22 MED ORDER — KETOCONAZOLE 2 % EX CREA: TOPICAL_CREAM | CUTANEOUS | 0 refills | Status: DC

## 2020-11-22 MED ORDER — HYDROCORTISONE 2.5 % EX CREA: TOPICAL_CREAM | Freq: Two times a day (BID) | CUTANEOUS | 11 refills | Status: DC | PRN

## 2020-11-22 MED ORDER — HYDROCORTISONE 2.5 % EX CREA: TOPICAL_CREAM | Freq: Two times a day (BID) | CUTANEOUS | 2 refills | Status: DC | PRN

## 2020-11-22 NOTE — Patient Instructions (Addendum)
Rash on chest  Start Hydrocortisone 2.5% cream apply to rash of the chest, Monday, Wednesday, Friday at bedtime  Start Ketoconazole 2% cream apply to rash of chest Tuesday, Thursday, Saturday at bedtime    Rash on eyelids  Start Hydrocortisone 2.5% cream apply to eyelids once a day 3 times a week   Dermatitis  Right lower leg  Start Hydrocortisone 2.5% cream apply to right lower leg  once a day 3 times a week   Start over the counter Amlactin rapid relief for dry skin

## 2020-11-22 NOTE — Progress Notes (Signed)
Follow-Up Visit   Subjective  Wendy Beck is a 75 y.o. female who presents for the following: Annual Exam (Mole check). Hx of BCC L forehead. Pt c/o irritated itchy  spots on the chest and  face. Pt has a list of spots she would like checked today  The patient presents for Total-Body Skin Exam (TBSE) for skin cancer screening and mole check.  The following portions of the chart were reviewed this encounter and updated as appropriate:   Tobacco  Allergies  Meds  Problems  Med Hx  Surg Hx  Fam Hx     Review of Systems:  No other skin or systemic complaints except as noted in HPI or Assessment and Plan.  Objective  Well appearing patient in no apparent distress; mood and affect are within normal limits.  A full examination was performed including scalp, head, eyes, ears, nose, lips, neck, chest, axillae, abdomen, back, buttocks, bilateral upper extremities, bilateral lower extremities, hands, feet, fingers, toes, fingernails, and toenails. All findings within normal limits unless otherwise noted below.  Objective  chest, right leg (21): Erythematous keratotic or waxy stuck-on papule or plaque.   Objective  inframammary: Erythematous patch   Objective  eyelids and R leg: Scaly erythematous papules and plaques +/- edema and vesiculation.   Objective  legs: Dry skin    Assessment & Plan  Inflamed seborrheic keratosis (21) chest, right leg Destruction of lesion - chest, right leg Complexity: simple   Destruction method: cryotherapy   Informed consent: discussed and consent obtained   Timeout:  patient name, date of birth, surgical site, and procedure verified Lesion destroyed using liquid nitrogen: Yes   Region frozen until ice ball extended beyond lesion: Yes   Outcome: patient tolerated procedure well with no complications   Post-procedure details: wound care instructions given    Erythema intertrigo inframammary Intertrigo is a chronic recurrent rash that  occurs in skin fold areas that may be associated with friction; heat; moisture; yeast; fungus; and bacteria.  It is exacerbated by increased movement / activity; sweating; and higher atmospheric temperature.   Start Hydrocortisone 2.5% cream apply to rash of the chest, Monday, Wednesday, Friday at bedtime  Start Ketoconazole 2% cream apply to rash of chest Tuesday, Thursday, Saturday at bedtime   Reordered Medications ketoconazole (NIZORAL) 2 % cream hydrocortisone 2.5 % cream  Dermatitis -eyelid and right leg eyelids and R leg Chronic and persistent Eyelid dermatitis  Start Hydrocortisone 2.5% cream apply to eyelids and legs once a day 3 times a week prn   Reordered Medications hydrocortisone 2.5 % cream  Xerosis cutis legs Start otc AmLactin rapid relief  Skin cancer screening   Lentigines - Scattered tan macules - Discussed due to sun exposure - Benign, observe - Call for any changes  Seborrheic Keratoses - Stuck-on, waxy, tan-brown papules and plaques  - Discussed benign etiology and prognosis. - Observe - Call for any changes  Melanocytic Nevi - Tan-brown and/or pink-flesh-colored symmetric macules and papules - Benign appearing on exam today - Observation - Call clinic for new or changing moles - Recommend daily use of broad spectrum spf 30+ sunscreen to sun-exposed areas.   Hemangiomas - Red papules - Discussed benign nature - Observe - Call for any changes  Actinic Damage - Chronic, secondary to cumulative UV/sun exposure - diffuse scaly erythematous macules with underlying dyspigmentation - Recommend daily broad spectrum sunscreen SPF 30+ to sun-exposed areas, reapply every 2 hours as needed.  - Call for new or  changing lesions.  Skin cancer screening performed today.  Return in about 3 months (around 02/19/2021) for dermatitis .  IMarye Round, CMA, am acting as scribe for Sarina Ser, MD .  Documentation: I have reviewed the above  documentation for accuracy and completeness, and I agree with the above.  Sarina Ser, MD

## 2020-11-23 ENCOUNTER — Encounter: Payer: Self-pay | Admitting: Dermatology

## 2020-11-24 NOTE — Telephone Encounter (Signed)
Signing off encounter.  

## 2021-01-03 IMAGING — US ULTRASOUND ABDOMEN LIMITED
1 series · 14 of 22 positions shown · non-contrast
Comparison: No recent prior.

CLINICAL DATA: Epigastric pain.

EXAM:
ULTRASOUND ABDOMEN LIMITED RIGHT UPPER QUADRANT

[Series 1: ultrasound abdomen limited · 0.23mm/px · 14 of 22 slices shown]
[im 1/22]
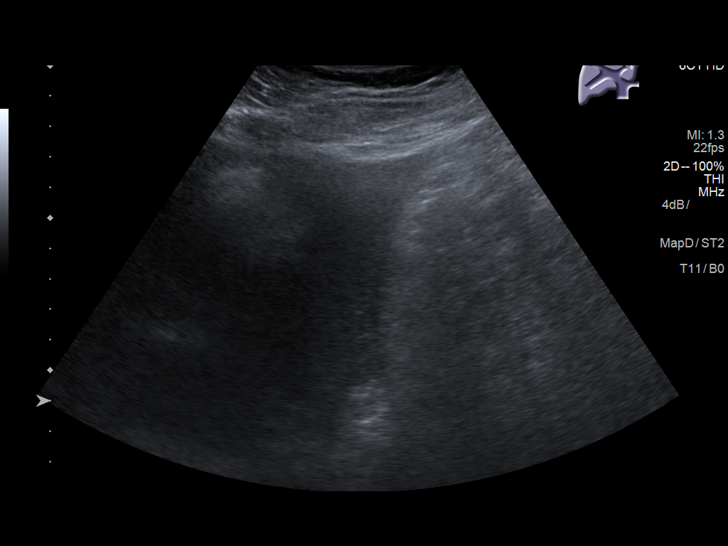
[im 3/22]
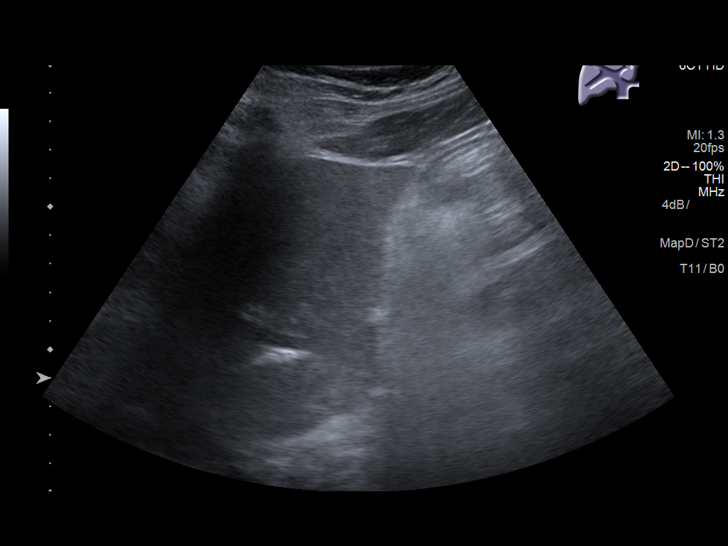
[im 4/22]
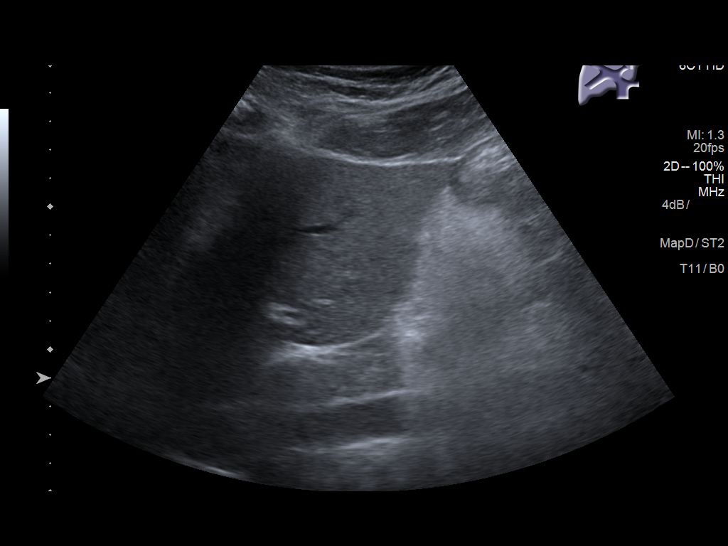
[im 6/22]
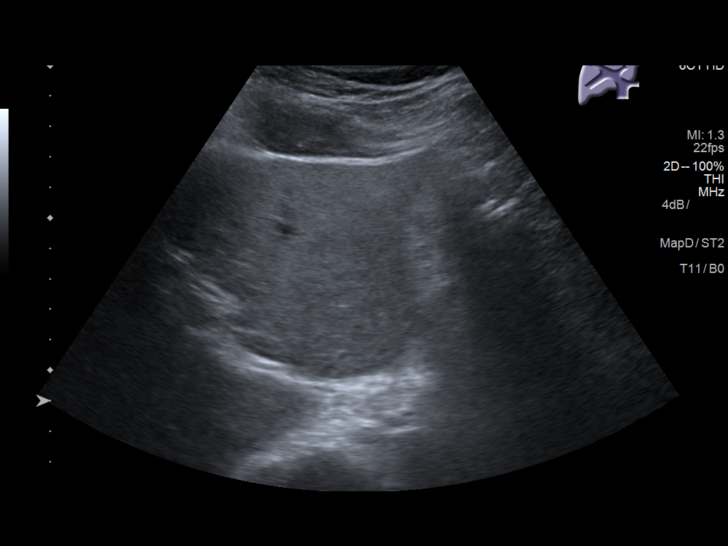
[im 8/22]
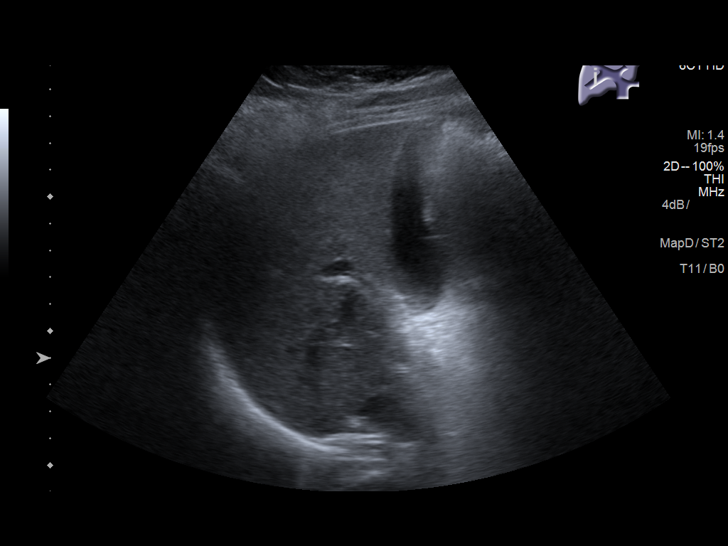
[im 9/22]
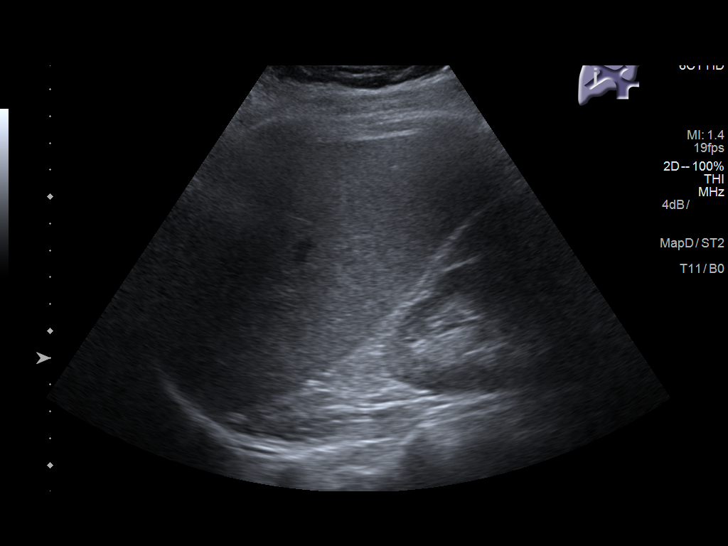
[im 11/22]
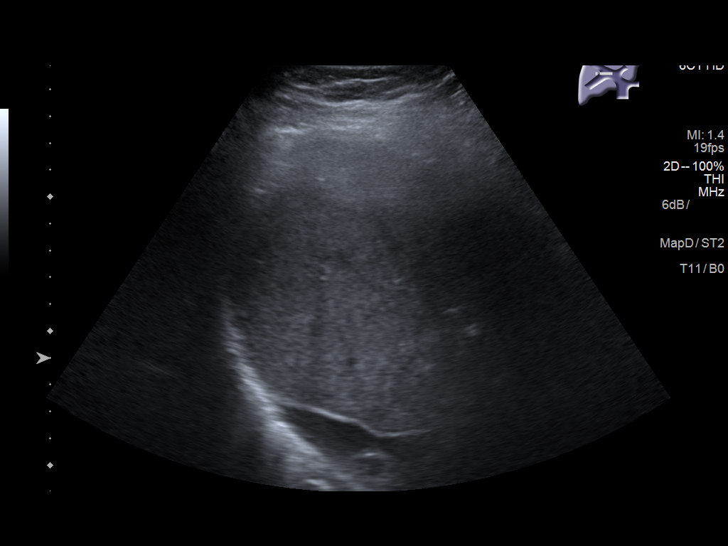
[im 12/22]
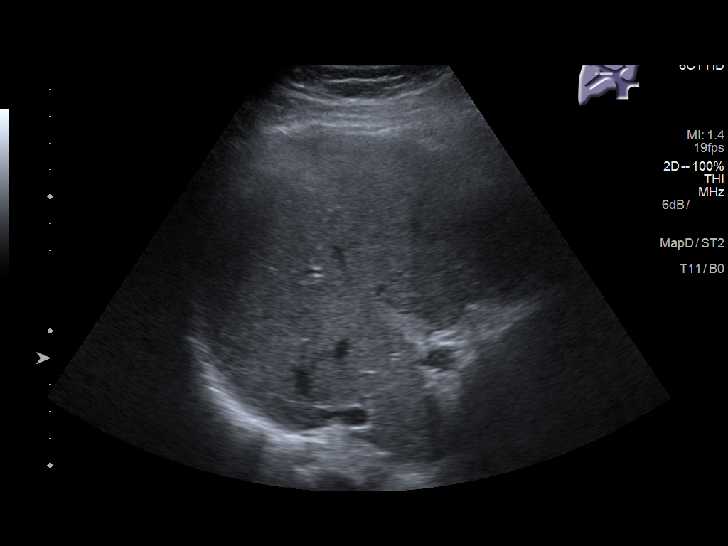
[im 14/22]
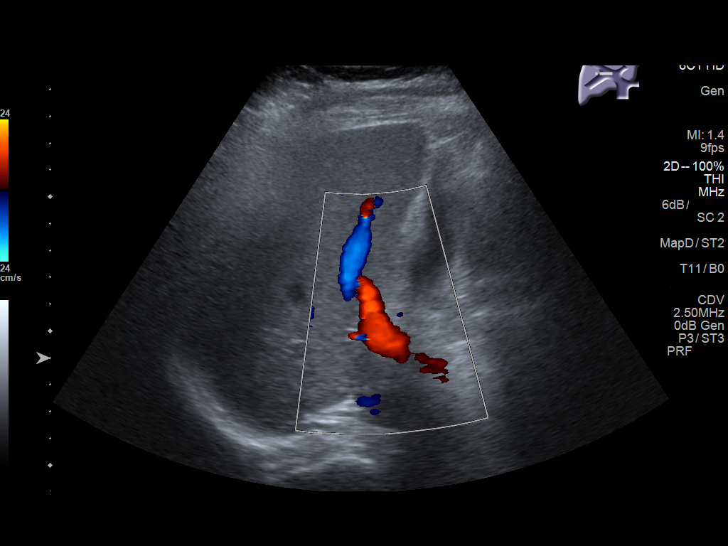
[im 15/22]
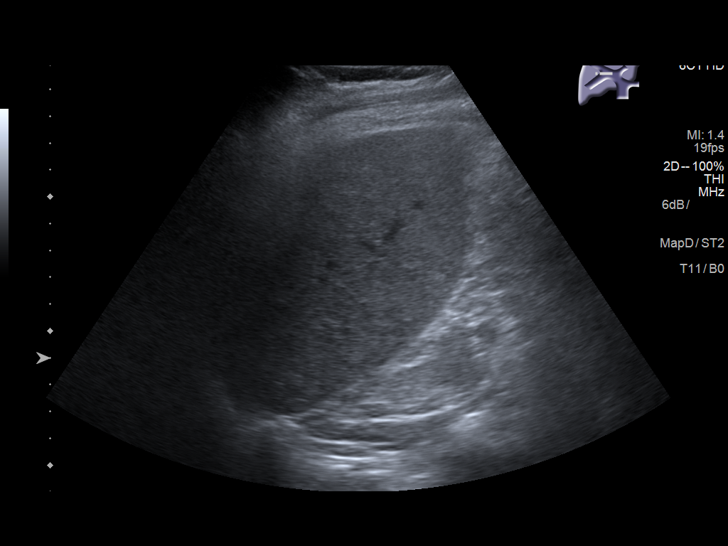
[im 17/22]
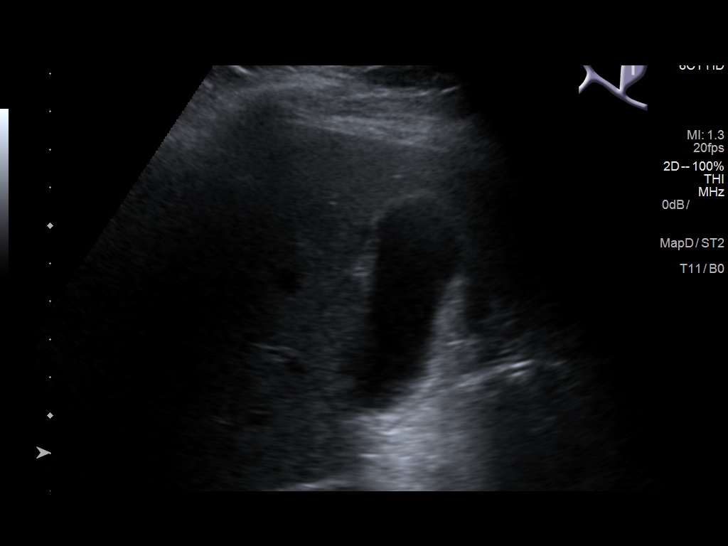
[im 19/22]
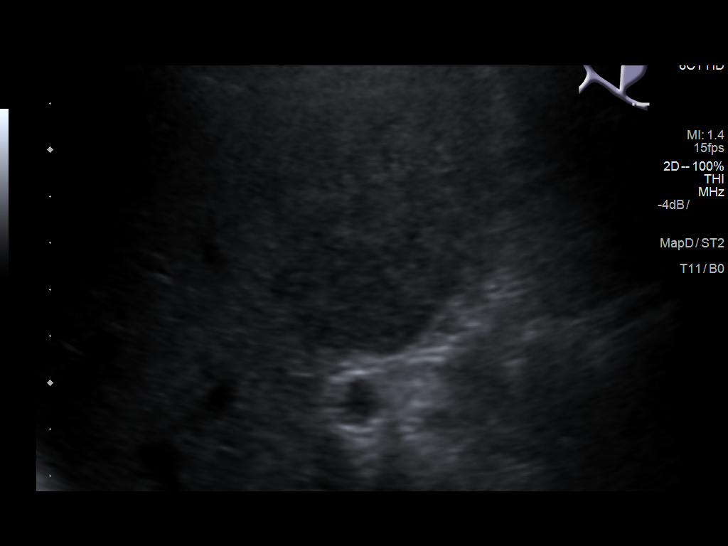
[im 20/22]
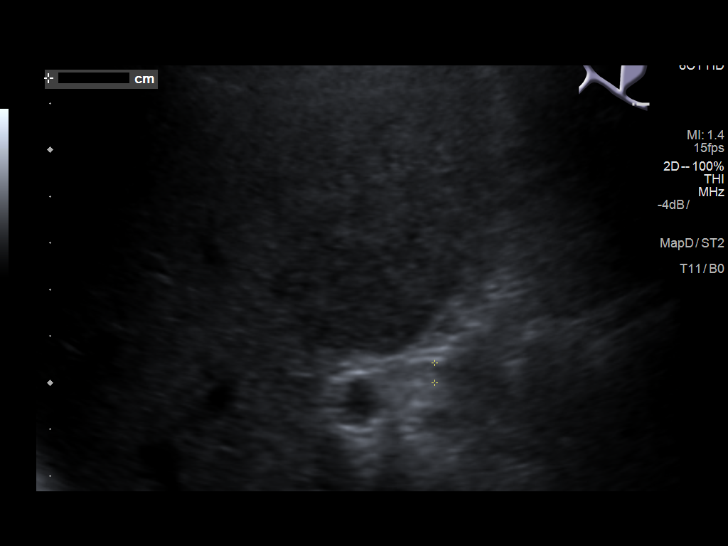
[im 22/22]
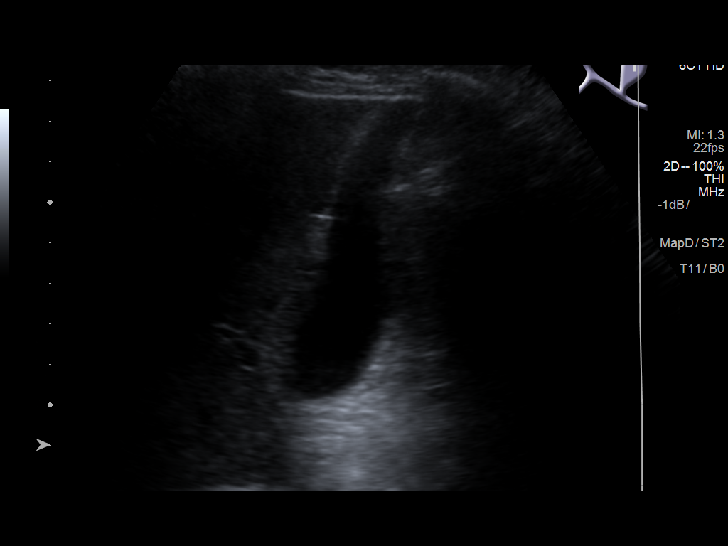

[14 of 22 positions shown; findings below may reference images not displayed]

FINDINGS: Gallbladder:

No gallstones or wall thickening visualized. No sonographic Murphy
sign noted by sonographer.

Common bile duct:

Diameter: 4.3 mm

Liver:

No focal lesion identified. Within normal limits in parenchymal
echogenicity. Portal vein is patent on color Doppler imaging with
normal direction of blood flow towards the liver.
IMPRESSION: Negative exam.

## 2021-02-21 ENCOUNTER — Other Ambulatory Visit: Payer: Self-pay

## 2021-02-21 ENCOUNTER — Ambulatory Visit (INDEPENDENT_AMBULATORY_CARE_PROVIDER_SITE_OTHER): Payer: Medicare Other | Admitting: Dermatology

## 2021-02-21 DIAGNOSIS — L309 Dermatitis, unspecified: Secondary | ICD-10-CM | POA: Diagnosis not present

## 2021-02-21 DIAGNOSIS — I872 Venous insufficiency (chronic) (peripheral): Secondary | ICD-10-CM | POA: Diagnosis not present

## 2021-02-21 DIAGNOSIS — L258 Unspecified contact dermatitis due to other agents: Secondary | ICD-10-CM | POA: Diagnosis not present

## 2021-02-21 DIAGNOSIS — L820 Inflamed seborrheic keratosis: Secondary | ICD-10-CM | POA: Diagnosis not present

## 2021-02-21 DIAGNOSIS — L304 Erythema intertrigo: Secondary | ICD-10-CM | POA: Diagnosis not present

## 2021-02-21 MED ORDER — HYDROCORTISONE 2.5 % EX CREA: TOPICAL_CREAM | Freq: Two times a day (BID) | CUTANEOUS | 4 refills | Status: DC | PRN

## 2021-02-21 MED ORDER — KETOCONAZOLE 2 % EX CREA: TOPICAL_CREAM | CUTANEOUS | 4 refills | Status: DC

## 2021-02-21 NOTE — Progress Notes (Signed)
Follow-Up Visit   Subjective  Wendy Beck is a 75 y.o. female who presents for the following: 3 month follow up (Patient here today for 3 month follow up on dermatitis on right leg and eyelids. She was prescribed hydrocortisone 2.5 % cream to use. She reports today some itchy areas behind ears. ).  She also has a rash under her breast.  The following portions of the chart were reviewed this encounter and updated as appropriate:  Tobacco  Allergies  Meds  Problems  Med Hx  Surg Hx  Fam Hx      Objective  Well appearing patient in no apparent distress; mood and affect are within normal limits.  A focused examination was performed including face, eyelids, behind ears, bilateral legs, inframmamary folds. Relevant physical exam findings are noted in the Assessment and Plan.  Objective  inframammary: Erythematous patch   Objective  Right Upper Back x 1, right clavicle x 1 (2), Right Upper Eyelid x 1: Erythematous keratotic or waxy stuck-on papule or plaque.   Objective  eyelids, right leg: Clear today   Objective  Right Lower Leg - Anterior: Erythematous, scaly patches involving the ankle and distal lower leg with associated lower leg edema.  Assessment & Plan  Erythema intertrigo -flared inframammary Active areas today  Intertrigo is a chronic recurrent rash that occurs in skin fold areas that may be associated with friction; heat; moisture; yeast; fungus; and bacteria.  It is exacerbated by increased movement / activity; sweating; and higher atmospheric temperature.  Start Zeasorb AF 1 % topical powder to affected areas as needed to prevent recurrence when rash is clear.   Continue using hydrocortisone 2.5 % cream topically 2 times daily as needed for rash  Apply prn on chest Monday, Wednesday, Friday at bedtime  Continue Ketoconazole 2% cream - apply topically to rash on chest Tuesday, Thursday, Saturday at bedtime.   Topical steroids (such as triamcinolone,  fluocinolone, fluocinonide, mometasone, clobetasol, halobetasol, betamethasone, hydrocortisone) can cause thinning and lightening of the skin if they are used for too [place] in the same area. Your physician has selected the right strength medicine for your problem and area affected on the body. Please use your medication only as directed by your physician to prevent side effects.   Reordered Medications hydrocortisone 2.5 % cream ketoconazole (NIZORAL) 2 % cream  Inflamed seborrheic keratosis (3) Right Upper Back x 1, right clavicle x 1 (2); Right Upper Eyelid x 1 Prior to procedure, discussed risks of blister formation, small wound, skin dyspigmentation, or rare scar following cryotherapy.   Destruction of lesion - Right Upper Back x 1, right clavicle x 1, Right Upper Eyelid x 1 Complexity: simple   Destruction method: cryotherapy   Informed consent: discussed and consent obtained   Timeout:  patient name, date of birth, surgical site, and procedure verified Lesion destroyed using liquid nitrogen: Yes   Region frozen until ice ball extended beyond lesion: Yes   Outcome: patient tolerated procedure well with no complications   Post-procedure details: wound care instructions given    Dermatitis eyelids, right leg Clear today Chronic and persistent -may be recurrent Continue as needed for flare  Hydrocortisone 2.5% cream apply to eyelids and legs once a day 3 times a week prn   Reordered Medications hydrocortisone 2.5 % cream  Contact dermatitis due to other agent, unspecified contact dermatitis type -irritant contact dermatitis Left Postauricular Area Dermatitis caused by mask strap behind bilateral ears.  Start hydrocortisone 2.5 % cream  to affected areas as needed for rash.  Topical steroids (such as triamcinolone, fluocinolone, fluocinonide, mometasone, clobetasol, halobetasol, betamethasone, hydrocortisone) can cause thinning and lightening of the skin if they are used for too [place]  in the same area. Your physician has selected the right strength medicine for your problem and area affected on the body. Please use your medication only as directed by your physician to prevent side effects.   Stasis dermatitis of both legs Right Lower Leg - Anterior Chronic and persistent Chronic condition with duration or expected duration over one year. Condition is bothersome to patient. Currently flared. Continue to use Hydrocortisone 2.5 % cream to affected areas of bilateral lower legs for rash.  Topical steroids (such as triamcinolone, fluocinolone, fluocinonide, mometasone, clobetasol, halobetasol, betamethasone, hydrocortisone) can cause thinning and lightening of the skin if they are used for too [place] in the same area. Your physician has selected the right strength medicine for your problem and area affected on the body. Please use your medication only as directed by your physician to prevent side effects.   Return in about 6 months (around 08/24/2021) for tbse.  IRuthell Rummage, CMA, am acting as scribe for Sarina Ser, MD.  Documentation: I have reviewed the above documentation for accuracy and completeness, and I agree with the above.  Sarina Ser, MD

## 2021-02-21 NOTE — Patient Instructions (Addendum)
Cryotherapy Aftercare  . Wash gently with soap and water everyday.   Marland Kitchen Apply Vaseline and Band-Aid daily until healed. .   Start Zeasorb AF 1 % topical powder - apply to areas under breast as needed for rash.   Atopic dermatitis (eczema) is a chronic, relapsing, pruritic condition that can significantly affect quality of life. It is often associated with allergic rhinitis and/or asthma and can require treatment with topical medications, phototherapy, or in severe cases a biologic medication called Dupixent in older children and adults.   Topical steroids (such as triamcinolone, fluocinolone, fluocinonide, mometasone, clobetasol, halobetasol, betamethasone, hydrocortisone) can cause thinning and lightening of the skin if they are used for too [place] in the same area. Your physician has selected the right strength medicine for your problem and area affected on the body. Please use your medication only as directed by your physician to prevent side effects.   If you have any questions or concerns for your doctor, please call our main line at [phone] and press option 4 to reach your doctor's medical assistant. If no one answers, please leave a voicemail as directed and we will return your call as soon as possible. Messages left after 4 pm will be answered the following business day.   You may also send [place] a message via [place]. We typically respond to MyChart messages within 1-2 business days.  For prescription refills, please ask your pharmacy to contact our office. Our fax number is [phone].  If you have an urgent issue when the clinic is closed that cannot wait until the next business day, you can page your doctor at the number below.    Please note that while we do our best to be available for urgent issues outside of office hours, we are not available 24/7.   If you have an urgent issue and are unable to reach [place], you may choose to seek medical care at your doctor's office, retail  clinic, urgent care center, or emergency room.  If you have a medical emergency, please immediately call 911 or go to the emergency department.  Pager Numbers  - Dr. Nehemiah Massed: [phone]  - Dr. Laurence Ferrari: [phone]  - Dr. Nicole Kindred: [phone]  In the event of inclement weather, please call our main line at [phone] for an update on the status of any delays or closures.  Dermatology Medication Tips: Please keep the boxes that topical medications come in in order to help keep track of the instructions about where and how to use these. Pharmacies typically print the medication instructions only on the boxes and not directly on the medication tubes.   If your medication is too expensive, please contact our office at [phone] option 4 or send [place] a message through [place].   We are unable to tell what your co-pay for medications will be in advance as this is different depending on your insurance coverage. However, we may be able to find a substitute medication at lower cost or fill out paperwork to get insurance to cover a needed medication.   If a prior authorization is required to get your medication covered by your insurance company, please allow [place] 1-2 business days to complete this process.  Drug prices often vary depending on where the prescription is filled and some pharmacies may offer cheaper prices.  The website www.goodrx.com contains coupons for medications through different pharmacies. The prices here do not account for what the cost may be with help from insurance (it may be cheaper with your  insurance), but the website can give you the price if you did not use any insurance.  - You can print the associated coupon and take it with your prescription to the pharmacy.  - You may also stop by our office during regular business hours and pick up a GoodRx coupon card.  - If you need your prescription sent electronically to a different pharmacy, notify our office through [facility] or by phone at [phone] option 4.

## 2021-02-25 ENCOUNTER — Encounter: Payer: Self-pay | Admitting: Dermatology

## 2021-04-18 ENCOUNTER — Ambulatory Visit: Payer: Medicare Other

## 2021-04-22 NOTE — Progress Notes (Signed)
I,Wendy Beck,acting as a scribe for Wilhemena Durie, MD.,have documented all relevant documentation on the behalf of Wilhemena Durie, MD,as directed by  Wilhemena Durie, MD while in the presence of Wilhemena Durie, MD.   Annual Wellness Visit     Patient: Wendy Beck, Female    DOB: 10/08/1945, 75 y.o.   MRN: [id number] Visit Date: 04/25/2021  Today's Provider: Wilhemena Durie, MD   Chief Complaint  Patient presents with   Medicare Wellness   Subjective    Wendy Beck is a 75 y.o. female who presents today for her Annual Wellness Visit. She reports consuming a general diet. The patient does not participate in regular exercise at present. She generally feels fairly well. She reports sleeping fairly well. She does not have additional problems to discuss today.   HPI Her main complaints and concerns continue to center around her inability to lose weight as she gets older.  My biggest concern is that she has 2 dogs that she attempts to walk and couple days ago tripped over her dog leashes and fell.  Other than some bruising she was fine. She has had 2 COVID vaccines but not a booster.  She has not had her shingles vaccine.      Medications: Outpatient Medications Prior to Visit  Medication Sig   Azelastine HCl 137 MCG/SPRAY SOLN one spray by Both Nostrils route daily.   b complex vitamins tablet Take 1 tablet by mouth daily with lunch.   cholecalciferol (VITAMIN D) 1000 units tablet Take 2,000 Units by mouth daily with lunch.    Cholecalciferol (VITAMIN D3) 5000 units CAPS Take by mouth daily.    Coenzyme Q10 300 MG WAFR Take 300 mg by mouth daily with lunch.   Cyanocobalamin (VITAMIN B-12) 5000 MCG SUBL Place under the tongue daily.   diclofenac Sodium (VOLTAREN) 1 % GEL Apply 4 g topically 4 (four) times daily. As needed   fexofenadine-pseudoephedrine (ALLEGRA-D) 60-120 MG 12 hr tablet Take 1 tablet by mouth 2 (two) times daily.    Glucosamine Sulfate  (DONA PO) Take 2 tablets by mouth daily with lunch.    hydrocortisone 2.5 % cream Apply topically 2 (two) times daily as needed (Rash). Apply to rash on chest Monday, Wednesday, Friday at bedtme  Apply to rash on eyelids and lower leg 3 times a week Can also use behind ears as needed for rash   ketoconazole (NIZORAL) 2 % cream Apply to rash on the chest Tuesday, Thursday, Saturday at bedtime   mometasone (ELOCON) 0.1 % cream Apply 1 application topically 2 (two) times daily.   mometasone (ELOCON) 0.1 % lotion Apply topically daily.   Multiple Vitamins-Minerals (HAIR VITAMINS PO) Take 2 tablets by mouth daily with lunch.    Omega-3 Fatty Acids (FISH OIL ULTRA) 1400 MG CAPS Take 4,200 mg by mouth daily at 2 PM.   omeprazole (PRILOSEC) 20 MG capsule Take 20 mg by mouth daily. As needed   Pitavastatin Calcium 4 MG TABS Take 4 mg by mouth daily. Patient will hold for elevated lft labwork   potassium chloride (KLOR-CON) 10 MEQ tablet Take 10 mEq by mouth daily. As needed   [DISCONTINUED] atorvastatin (LIPITOR) 40 MG tablet Take 1 tablet (40 mg total) by mouth daily.   [DISCONTINUED] bisoprolol-hydrochlorothiazide (ZIAC) 5-6.25 MG tablet Take 1 tablet by mouth daily.   [DISCONTINUED] levothyroxine (SYNTHROID) 100 MCG tablet Take 1 tablet (100 mcg total) by mouth daily before breakfast.   [DISCONTINUED]  losartan (COZAAR) 50 MG tablet Take 2 tablets (100 mg total) by mouth daily.   [DISCONTINUED] metFORMIN (GLUCOPHAGE-XR) 500 MG 24 hr tablet Take 2 tablets by mouth once daily   [DISCONTINUED] modafinil (PROVIGIL) 200 MG tablet Take 1 tablet (200 mg total) by mouth daily.   [DISCONTINUED] mometasone (ELOCON) 0.1 % cream Apply 1 application topically daily as needed (Rash).   No facility-administered medications prior to visit.    Allergies  Allergen Reactions   Niacin And Related Hives    flushing   Ezetimibe Other (See Comments)    Increased lft     Statins Other (See Comments)    Muscle  aches Increased lft     Patient Care Team: Jerrol Banana., MD as PCP - General (Family Medicine) Marry Guan, Laurice Record, MD (Orthopedic Surgery) Ralene Bathe, MD (Dermatology) Rodney Booze as Physician Assistant  Review of Systems  Constitutional:  Positive for fatigue.  Eyes:  Positive for itching.  Respiratory:  Positive for apnea.   Musculoskeletal:  Positive for arthralgias and gait problem.  All other systems reviewed and are negative.       Objective    Vitals: BP (!) 154/73 (BP Location: Left Arm, Patient Position: Sitting, Cuff Size: Large)   Pulse 63   Temp 97.7 F (36.5 C) (Oral)   Resp 16   Ht '5\' 3"'$  (1.6 m)   Wt 181 lb (82.1 kg)   SpO2 96%   BMI 32.06 kg/m  BP Readings from Last 3 Encounters:  04/25/21 (!) 154/73  11/17/20 (!) 145/57  10/08/20 (!) 138/59   Wt Readings from Last 3 Encounters:  04/25/21 181 lb (82.1 kg)  11/17/20 179 lb (81.2 kg)  07/13/20 177 lb (80.3 kg)      Physical Exam Vitals reviewed.  Constitutional:      Appearance: Normal appearance.  HENT:     Head: Normocephalic and atraumatic.     Right Ear: External ear normal.     Left Ear: External ear normal.  Eyes:     General: No scleral icterus.    Conjunctiva/sclera: Conjunctivae normal.  Cardiovascular:     Rate and Rhythm: Normal rate and regular rhythm.     Pulses: Normal pulses.     Heart sounds: Normal heart sounds.  Pulmonary:     Effort: Pulmonary effort is normal.     Breath sounds: Normal breath sounds.  Abdominal:     Palpations: Abdomen is soft.  Musculoskeletal:     Right lower leg: No edema.     Left lower leg: No edema.  Skin:    General: Skin is warm and dry.  Neurological:     General: No focal deficit present.     Mental Status: She is alert and oriented to person, place, and time.  Psychiatric:        Mood and Affect: Mood normal.        Behavior: Behavior normal.        Thought Content: Thought content normal.         Judgment: Judgment normal.     Most recent functional status assessment: In your present state of health, do you have any difficulty performing the following activities: 04/25/2021  Hearing? N  Vision? N  Difficulty concentrating or making decisions? Y  Walking or climbing stairs? Y  Dressing or bathing? N  Doing errands, shopping? N  Some recent data might be hidden   Most recent fall risk assessment: Fall Risk  04/25/2021  Falls in the past year? 1  Number falls in past yr: 1  Injury with Fall? 1  Risk for fall due to : History of fall(s)  Follow up Falls evaluation completed    Most recent depression screenings: PHQ 2/9 Scores 04/25/2021 11/17/2020  PHQ - 2 Score 2 0  PHQ- 9 Score 4 4   Most recent cognitive screening: No flowsheet data found. Most recent Audit-C alcohol use screening Alcohol Use Disorder Test (AUDIT) 04/25/2021  1. How often do you have a drink containing alcohol? 0  2. How many drinks containing alcohol do you have on a typical day when you are drinking? 0  3. How often do you have six or more drinks on one occasion? 0  AUDIT-C Score 0  Alcohol Brief Interventions/Follow-up -   A score of 3 or more in women, and 4 or more in men indicates increased risk for alcohol abuse, EXCEPT if all of the points are from question 1   No results found for any visits on 04/25/21.  Assessment & Plan     Annual wellness visit done today including the all of the following: Reviewed patient's Family Medical History Reviewed and updated list of patient's medical providers Assessment of cognitive impairment was done Assessed patient's functional ability Established a written schedule for health screening [place] Completed and Reviewed  Exercise Activities and Dietary recommendations  Goals      Prevent falls     Recommend to remove any items from the home that may cause slips or trips.        Immunization History  Administered Date(s)  Administered   Fluad Quad(high Dose 65+) 08/05/2019, 07/13/2020   Hep A / Hep B 09/16/2019, 10/24/2019, 03/23/2020   Hepatitis A 09/16/2019, 10/24/2019   Hepatitis B 09/16/2019, 10/24/2019   Influenza Split 06/26/2008, 06/21/2009, 08/12/2010   Influenza, High Dose Seasonal PF 06/19/2014, 07/02/2017, 08/14/2017, 06/26/2018   Influenza,inj,Quad PF,6+ Mos 07/08/2012   Influenza,inj,quad, With Preservative 07/12/2015   PFIZER(Purple Top)SARS-COV-2 Vaccination 11/05/2019, 12/09/2019   Pneumococcal Conjugate-13 01/20/2015   Pneumococcal Polysaccharide-23 08/14/2011   Td 06/25/1998, 09/20/2009   Tdap 11/12/2014    Health Maintenance  Topic Date Due   Hepatitis C Screening  Never done   Zoster Vaccines- Shingrix (1 of 2) Never done   COVID-19 Vaccine (3 - Pfizer risk series) 01/06/2020   INFLUENZA VACCINE  05/02/2021   DEXA SCAN  07/23/2024   TETANUS/TDAP  11/12/2024   PNA vac Low Risk Adult  Completed   HPV VACCINES  Aged Out   1. Encounter for Medicare annual wellness exam   2. Type 2 diabetes mellitus with other circulatory complication, without Ivancic-term current use of insulin (HCC) Goal A1c less than 7.  Refer to lifestyle Center/dietitian for weight loss and diabetes - Hemoglobin A1c - Referral to Nutrition and Diabetes Services - metFORMIN (GLUCOPHAGE-XR) 500 MG 24 hr tablet; Take 2 tablets by mouth once daily  Dispense: 180 tablet; Refill: 3  3. Essential hypertension with goal blood pressure less than 140/90 On Ziac will add ARB  - Comprehensive metabolic panel - bisoprolol-hydrochlorothiazide (ZIAC) 5-6.25 MG tablet; Take 1 tablet by mouth daily.  Dispense: 90 tablet; Refill: 3 - losartan (COZAAR) 50 MG tablet; Take 2 tablets (100 mg total) by mouth daily.  Dispense: 180 tablet; Refill: 3  4. Palpitations Follow-up with cardiology Also has a remote history of possible valvular disease. - Ambulatory referral to Cardiology  5. Hyperlipidemia, unspecified hyperlipidemia  type  - Lipid  panel - atorvastatin (LIPITOR) 40 MG tablet; Take 1 tablet (40 mg total) by mouth daily.  Dispense: 90 tablet; Refill: 3  6. Hypothyroidism, unspecified type  - levothyroxine (SYNTHROID) 100 MCG tablet; Take 1 tablet (100 mcg total) by mouth daily before breakfast.  Dispense: 90 tablet; Refill: 3  7. Primary narcolepsy without cataplexy Followed by neurology in the past.  May need to refer back to neurology for this. - modafinil (PROVIGIL) 200 MG tablet; Take 1 tablet (200 mg total) by mouth daily.  Dispense: 90 tablet; Refill: 1  8. Chronic anxiety Ongoing issue for this patient   Discussed health benefits of physical activity, and encouraged her to engage in regular exercise appropriate for her age and condition.     Return in about 5 months (around 09/25/2021).     I, Wilhemena Durie, MD, have reviewed all documentation for this visit. The documentation on 04/27/21 for the exam, diagnosis, procedures, and orders are all accurate and complete.    Wendy Beck Mon, MD  [facility] [phone] (phone) [phone] (fax)  [place]

## 2021-04-25 ENCOUNTER — Encounter: Payer: Self-pay | Admitting: Family Medicine

## 2021-04-25 ENCOUNTER — Other Ambulatory Visit: Payer: Self-pay

## 2021-04-25 ENCOUNTER — Ambulatory Visit (INDEPENDENT_AMBULATORY_CARE_PROVIDER_SITE_OTHER): Payer: Medicare Other | Admitting: Family Medicine

## 2021-04-25 VITALS — BP 154/73 | HR 63 | Temp 97.7°F | Resp 16 | Ht 63.0 in | Wt 181.0 lb

## 2021-04-25 DIAGNOSIS — E1159 Type 2 diabetes mellitus with other circulatory complications: Secondary | ICD-10-CM | POA: Diagnosis not present

## 2021-04-25 DIAGNOSIS — R002 Palpitations: Secondary | ICD-10-CM

## 2021-04-25 DIAGNOSIS — I10 Essential (primary) hypertension: Secondary | ICD-10-CM

## 2021-04-25 DIAGNOSIS — E785 Hyperlipidemia, unspecified: Secondary | ICD-10-CM | POA: Diagnosis not present

## 2021-04-25 DIAGNOSIS — G47419 Narcolepsy without cataplexy: Secondary | ICD-10-CM

## 2021-04-25 DIAGNOSIS — E039 Hypothyroidism, unspecified: Secondary | ICD-10-CM | POA: Diagnosis not present

## 2021-04-25 DIAGNOSIS — F419 Anxiety disorder, unspecified: Secondary | ICD-10-CM

## 2021-04-25 DIAGNOSIS — Z0000 Encounter for general adult medical examination without abnormal findings: Secondary | ICD-10-CM | POA: Diagnosis not present

## 2021-04-25 MED ORDER — MODAFINIL 200 MG PO TABS: 200.0000 mg | ORAL_TABLET | Freq: Every day | ORAL | 1 refills | Status: DC

## 2021-04-25 MED ORDER — LEVOTHYROXINE SODIUM 100 MCG PO TABS: 100.0000 ug | ORAL_TABLET | Freq: Every day | ORAL | 3 refills | Status: DC

## 2021-04-25 MED ORDER — LOSARTAN POTASSIUM 50 MG PO TABS: 100.0000 mg | ORAL_TABLET | Freq: Every day | ORAL | 3 refills | Status: DC

## 2021-04-25 MED ORDER — BISOPROLOL-HYDROCHLOROTHIAZIDE 5-6.25 MG PO TABS: 1.0000 | ORAL_TABLET | Freq: Every day | ORAL | 3 refills | Status: DC

## 2021-04-25 MED ORDER — METFORMIN HCL ER 500 MG PO TB24: ORAL_TABLET | ORAL | 3 refills | Status: DC

## 2021-04-25 MED ORDER — ATORVASTATIN CALCIUM 40 MG PO TABS: 40.0000 mg | ORAL_TABLET | Freq: Every day | ORAL | 3 refills | Status: DC

## 2021-04-25 NOTE — Patient Instructions (Signed)
Get Covid booster and Shingles vaccine at the pharmacy.

## 2021-04-26 LAB — COMPREHENSIVE METABOLIC PANEL WITH GFR
ALT: 33 [IU]/L — ABNORMAL HIGH (ref 0–32)
AST: 25 [IU]/L (ref 0–40)
Albumin/Globulin Ratio: 1.9 (ref 1.2–2.2)
Albumin: 4.5 g/dL (ref 3.7–4.7)
Alkaline Phosphatase: 97 [IU]/L (ref 44–121)
BUN/Creatinine Ratio: 24 (ref 12–28)
BUN: 21 mg/dL (ref 8–27)
Bilirubin Total: 0.6 mg/dL (ref 0.0–1.2)
CO2: 23 mmol/L (ref 20–29)
Calcium: 9.8 mg/dL (ref 8.7–10.3)
Chloride: 102 mmol/L (ref 96–106)
Creatinine, Ser: 0.86 mg/dL (ref 0.57–1.00)
Globulin, Total: 2.4 g/dL (ref 1.5–4.5)
Glucose: 128 mg/dL — ABNORMAL HIGH (ref 65–99)
Potassium: 4.3 mmol/L (ref 3.5–5.2)
Sodium: 141 mmol/L (ref 134–144)
Total Protein: 6.9 g/dL (ref 6.0–8.5)
eGFR: 70 mL/min/{1.73_m2}

## 2021-04-26 LAB — HEMOGLOBIN A1C
Est. average glucose Bld gHb Est-mCnc: 148 mg/dL
Hgb A1c MFr Bld: 6.8 % — ABNORMAL HIGH (ref 4.8–5.6)

## 2021-04-26 LAB — LIPID PANEL
Chol/HDL Ratio: 3.4 ratio (ref 0.0–4.4)
Cholesterol, Total: 151 mg/dL (ref 100–199)
HDL: 45 mg/dL
LDL Chol Calc (NIH): 89 mg/dL (ref 0–99)
Triglycerides: 90 mg/dL (ref 0–149)
VLDL Cholesterol Cal: 17 mg/dL (ref 5–40)

## 2021-05-18 ENCOUNTER — Ambulatory Visit: Payer: Medicare Other | Admitting: Dermatology

## 2021-06-20 ENCOUNTER — Other Ambulatory Visit: Payer: Self-pay

## 2021-06-20 ENCOUNTER — Encounter: Payer: Self-pay | Admitting: Dietician

## 2021-06-20 ENCOUNTER — Encounter: Payer: Medicare Other | Attending: Family Medicine | Admitting: Dietician

## 2021-06-20 VITALS — Ht 63.0 in | Wt 182.0 lb

## 2021-06-20 DIAGNOSIS — E785 Hyperlipidemia, unspecified: Secondary | ICD-10-CM | POA: Diagnosis not present

## 2021-06-20 DIAGNOSIS — Z713 Dietary counseling and surveillance: Secondary | ICD-10-CM | POA: Diagnosis not present

## 2021-06-20 DIAGNOSIS — I10 Essential (primary) hypertension: Secondary | ICD-10-CM | POA: Diagnosis not present

## 2021-06-20 DIAGNOSIS — E1159 Type 2 diabetes mellitus with other circulatory complications: Secondary | ICD-10-CM | POA: Diagnosis present

## 2021-06-20 DIAGNOSIS — E119 Type 2 diabetes mellitus without complications: Secondary | ICD-10-CM

## 2021-06-20 NOTE — Progress Notes (Signed)
Medical Nutrition Therapy: Visit start time: 1100  end time: 1220  Assessment:  Diagnosis: Type 2 diabetes Past medical history: hyperlipidemia, hypertension Psychosocial issues/ stress concerns: reports high stress level, feels she is dealing ok or not so well with her stress.  Preferred learning method:  Auditory   Current weight: 182.lbs Height: 5'3" BMI: 32.24 Medications, supplements: reconciled list in medical record  Progress and evaluation:  Patient reports increased stress with multiple deaths among family and friends recently, caring for husband after his stroke. She feels she is eating well currently; has made recent changes to reduce unhealthy foods including meats. Often eats homemade soup -- uses Better than Bouillon soup base + variety of veg, lean meat (beef), canned peas and tomatoes, [place]terrey steak sauce She seeks help with meal ideas and healthy ways to season foods. Recent lab results indicate: HbA1C 6.8%; total cholesterol 151, HDL 45, LDL 89, Triglycerides 90 while taking statin Patient is unable to taking statin [place] term due to increases in LFTs.  Physical activity: ADLs, previous knee injury after fall  Dietary Intake:  Usual eating pattern includes 3 meals and 0-1 snacks per day. Dining out frequency: 3-6 meals per week.  Breakfast: pre-cooked bacon + 2 eggs with cheese + 1pc dry rye toast; occ out 2 eggs, 2pcs bacon; waffle house waffle occasionally; likes cream of wheat Snack: none Lunch: homemade soup; sandwich with ham Snack: occasionally soup or other snack food Supper: occasionally fried fish with slaw and baked beans; veg plate-- collards, pintos, apples; occ meat loaf; homemade soup;  Snack: none Beverages: unsweetened tea, water  Nutrition Care Education: Topics covered:  Basic nutrition: basic food groups, appropriate nutrient balance, appropriate meal and snack schedule, general nutrition guidelines    Weight control: importance of low sugar  and low fat choices; portion control; estimated energy needs for weight loss at 1300 kcal, provided guidance for 45% CHO, 25%protein Diabetes:  appropriate meal and snack schedule, appropriate carb intake and balance, healthy carb choices, role of fiber, protein, fat; effects of stress; role of physical activity Hypertension:  identifying high sodium foods, identifying food sources of potassium, magnesium Hyperlipidemia: healthy and unhealthy fats, role of fiber, role of exercise   Nutritional Diagnosis:  [place]-2.2 Altered nutrition-related laboratory As related to diabetes.  As evidenced by elevated HbA1C. [place]-3.3 Overweight/obesity As related to history of excess calories and inadequate physical activity due to foot and knee pain.  As evidenced by patient with current BMI of 32.3.  Intervention:  Instruction and discussion as noted above. Patient has been making some positive diet changes, reports motivation to continue. Advised limiting saturated fat intake to minimize changes in blood lipids when not taking statin medication. Established other nutrition goals with input from patient. No MNT follow up scheduled at this time; patient to schedule later if needed.  Education Materials given:  [employer] guidelines for Diabetes Plate Planner with food lists, sample meal pattern Sample menus Visit summary with goals/ instructions   Learner/ who was taught:  Patient    Level of understanding: Verbalizes/ demonstrates competency   Demonstrated degree of understanding via:   Teach back Learning barriers: None  Willingness to learn/ readiness for change: Eager, change in progress   Monitoring and Evaluation:  Dietary intake, exercise, BG control, BP control, blood lipids, and body weight      follow up: prn

## 2021-06-20 NOTE — Patient Instructions (Signed)
Use small amounts of added fats such as salad dressing (especially blue cheese), butter/ margarine, mayonnaise Limit red meats, especially processed red meats like bacon, sausage, bbq, ribs, steak. Choose mostly chicken and fish or meatless meals, or occasional extra lean beef or pork. Add a protein food to cream of wheat -- try 1/4 c nuts or 1-2 eggs or egg whites.  Stay active as much as possible; short periods of time several times a day is fine, such as a 10 minute walk, or some chair exercise.

## 2021-07-05 ENCOUNTER — Telehealth: Payer: Self-pay | Admitting: Family Medicine

## 2021-07-05 DIAGNOSIS — E039 Hypothyroidism, unspecified: Secondary | ICD-10-CM

## 2021-07-05 NOTE — Telephone Encounter (Signed)
[facility] faxed refill request for the following medications:    levothyroxine (SYNTHROID) 100 MCG tablet   Please advise.

## 2021-07-06 ENCOUNTER — Other Ambulatory Visit: Payer: Self-pay | Admitting: *Deleted

## 2021-07-06 DIAGNOSIS — E039 Hypothyroidism, unspecified: Secondary | ICD-10-CM

## 2021-07-06 MED ORDER — LEVOTHYROXINE SODIUM 100 MCG PO TABS: 100.0000 ug | ORAL_TABLET | Freq: Every day | ORAL | 3 refills | Status: DC

## 2021-07-06 NOTE — Telephone Encounter (Signed)
Rx sent to pharmacy   

## 2021-07-07 ENCOUNTER — Other Ambulatory Visit: Payer: Self-pay

## 2021-07-07 DIAGNOSIS — E039 Hypothyroidism, unspecified: Secondary | ICD-10-CM

## 2021-07-07 MED ORDER — LEVOTHYROXINE SODIUM 100 MCG PO TABS: 100.0000 ug | ORAL_TABLET | Freq: Every day | ORAL | 3 refills | Status: DC

## 2021-07-07 NOTE — Telephone Encounter (Signed)
Wendy Beck from [facility] would like a follow up call from PCP nurse due to the manufacture switching for levothyroxine (SYNTHROID) 100 MCG tablet.          [place], [place] Phone:  [phone]  Fax:  [phone]

## 2021-08-10 ENCOUNTER — Ambulatory Visit: Payer: Medicare Other | Admitting: Dermatology

## 2021-09-14 ENCOUNTER — Other Ambulatory Visit: Payer: Self-pay

## 2021-09-14 ENCOUNTER — Encounter: Payer: Self-pay | Admitting: Family Medicine

## 2021-09-14 ENCOUNTER — Ambulatory Visit (INDEPENDENT_AMBULATORY_CARE_PROVIDER_SITE_OTHER): Payer: Medicare Other | Admitting: Family Medicine

## 2021-09-14 VITALS — BP 154/74 | HR 64 | Temp 98.7°F | Resp 16 | Ht 63.0 in | Wt 179.0 lb

## 2021-09-14 DIAGNOSIS — E1159 Type 2 diabetes mellitus with other circulatory complications: Secondary | ICD-10-CM

## 2021-09-14 DIAGNOSIS — E785 Hyperlipidemia, unspecified: Secondary | ICD-10-CM

## 2021-09-14 DIAGNOSIS — R7989 Other specified abnormal findings of blood chemistry: Secondary | ICD-10-CM

## 2021-09-14 DIAGNOSIS — R7689 Other specified abnormal immunological findings in serum: Secondary | ICD-10-CM

## 2021-09-14 DIAGNOSIS — Z1159 Encounter for screening for other viral diseases: Secondary | ICD-10-CM

## 2021-09-14 DIAGNOSIS — I10 Essential (primary) hypertension: Secondary | ICD-10-CM

## 2021-09-14 DIAGNOSIS — Z148 Genetic carrier of other disease: Secondary | ICD-10-CM | POA: Diagnosis not present

## 2021-09-14 DIAGNOSIS — E538 Deficiency of other specified B group vitamins: Secondary | ICD-10-CM

## 2021-09-14 DIAGNOSIS — G47419 Narcolepsy without cataplexy: Secondary | ICD-10-CM | POA: Diagnosis not present

## 2021-09-14 DIAGNOSIS — E039 Hypothyroidism, unspecified: Secondary | ICD-10-CM

## 2021-09-14 DIAGNOSIS — R748 Abnormal levels of other serum enzymes: Secondary | ICD-10-CM | POA: Diagnosis not present

## 2021-09-14 DIAGNOSIS — R768 Other specified abnormal immunological findings in serum: Secondary | ICD-10-CM

## 2021-09-14 DIAGNOSIS — F32A Depression, unspecified: Secondary | ICD-10-CM

## 2021-09-14 DIAGNOSIS — E119 Type 2 diabetes mellitus without complications: Secondary | ICD-10-CM | POA: Diagnosis not present

## 2021-09-14 LAB — POCT GLYCOSYLATED HEMOGLOBIN (HGB A1C)
Est. average glucose Bld gHb Est-mCnc: 143
Hemoglobin A1C: 6.6 % — AB (ref 4.0–5.6)

## 2021-09-14 MED ORDER — LOSARTAN POTASSIUM 100 MG PO TABS: 100.0000 mg | ORAL_TABLET | Freq: Every day | ORAL | 3 refills | Status: DC

## 2021-09-14 MED ORDER — MODAFINIL 200 MG PO TABS: 200.0000 mg | ORAL_TABLET | Freq: Every day | ORAL | 1 refills | Status: DC

## 2021-09-14 NOTE — Assessment & Plan Note (Signed)
Doing well off meds. Continue to monitor.

## 2021-09-14 NOTE — Assessment & Plan Note (Signed)
A1c at goal, 6.6 today. No changes.

## 2021-09-14 NOTE — Assessment & Plan Note (Signed)
Doing well on current regimen, no changes made today. 

## 2021-09-14 NOTE — Assessment & Plan Note (Signed)
Single hemochromatosis carrier gene. Also on provigil, statin. Recheck labs.

## 2021-09-14 NOTE — Assessment & Plan Note (Addendum)
Slightly above goal for age but with in range pressures at home, no changes. Obtaining labs. Continue to follow with Cardiology.

## 2021-09-14 NOTE — Progress Notes (Signed)
° °  SUBJECTIVE:   CHIEF COMPLAINT / HPI:   Hypertension: - Medications: bisoprolol-HCTZ, losartan - Compliance: good - Checking BP at home: only if feels it may be high. 130s SBP at home - recently seen by Cardiology 07/2021 for palpitations. Holter monitor showed infrequent PACs and rare PVCs.  - Denies any SOB, CP, vision changes, LE edema, medication SEs,  Diabetes, Type 2 - Last A1c 6.8 04/2021 - Medications: metformin - Compliance: taking 1 pill once daily - Checking BG at home: no - Eye exam: reports in the last year. - Foot exam: UTD - Microalbumin: due - Statin: yes - PNA vaccine: UTD  HLD - medications: atorvastatin, fish oil - compliance: taking 4-5/7 days  - medication SEs: elevated LFTs  Hypothyroidism - Medications: Synthroid 124mcg - Current symptoms:  none - Denies change in energy level and diarrhea - Symptoms have been well-controlled  Depression, Anxiety - Medications: prozac - Taking: not taking. Previously was taking when moving years ago.  - Counseling: none - Symptoms: altered sleeping (could be attributable to narcolepsy) - Current stressors: husband - Coping Mechanisms: getting out and about, talking with friends.  Depression screen [facility] 2/9 09/14/2021 06/20/2021 04/25/2021  Decreased Interest 0 0 1  Down, Depressed, Hopeless 1 0 1  PHQ - 2 Score 1 0 2  Altered sleeping 1 - 1  Tired, decreased energy 2 - 1  Change in appetite 2 - 0  Feeling bad or failure about yourself  0 - 0  Trouble concentrating 0 - 0  Moving slowly or fidgety/restless 0 - 0  Suicidal thoughts 0 - 0  PHQ-9 Score 6 - 4  Difficult doing work/chores Not difficult at all - Not difficult at all   Narcolepsy - previously followed by Neuro. On provigil, tolerating well.    OBJECTIVE:   BP (!) 154/74 (BP Location: Right Arm, Patient Position: Sitting, Cuff Size: Large)    Pulse 64    Temp 98.7 F (37.1 C) (Temporal)    Resp 16    Ht 5\' 3"  (1.6 m)    Wt 179 lb (81.2 kg)     SpO2 95%    BMI 31.71 kg/m   Gen: well appearing, in NAD Card: RRR Lungs: CTAB Ext: WWP, no edema  ASSESSMENT/PLAN:   Essential hypertension with goal blood pressure less than 140/90 Slightly above goal for age but with in range pressures at home, no changes. Obtaining labs. Continue to follow with Cardiology.  Hypothyroidism Recheck labs.  Type 2 diabetes mellitus without complications (HCC) H6O at goal, 6.6 today. No changes.   Dyslipidemia, goal LDL below 100 Tolerant of reduced dose statin, continue. Recheck labs.  Narcolepsy Doing well on current regimen, no changes made today.  Elevated liver enzymes Single hemochromatosis carrier gene. Also on provigil, statin. Recheck labs.  Depression Doing well off meds. Continue to monitor.     Myles Gip, DO

## 2021-09-14 NOTE — Assessment & Plan Note (Signed)
Tolerant of reduced dose statin, continue. Recheck labs.

## 2021-09-14 NOTE — Assessment & Plan Note (Signed)
Recheck labs 

## 2021-09-17 LAB — CBC
Hematocrit: 42.4 % (ref 34.0–46.6)
Hemoglobin: 14.8 g/dL (ref 11.1–15.9)
MCH: 30.5 pg (ref 26.6–33.0)
MCHC: 34.9 g/dL (ref 31.5–35.7)
MCV: 87 fL (ref 79–97)
Platelets: 216 10*3/uL (ref 150–450)
RBC: 4.85 x10E6/uL (ref 3.77–5.28)
RDW: 11.7 % (ref 11.7–15.4)
WBC: 7.2 10*3/uL (ref 3.4–10.8)

## 2021-09-17 LAB — COMPREHENSIVE METABOLIC PANEL WITH GFR
ALT: 28 [IU]/L (ref 0–32)
AST: 21 [IU]/L (ref 0–40)
Albumin/Globulin Ratio: 1.9 (ref 1.2–2.2)
Albumin: 4.6 g/dL (ref 3.7–4.7)
Alkaline Phosphatase: 97 [IU]/L (ref 44–121)
BUN/Creatinine Ratio: 22 (ref 12–28)
BUN: 20 mg/dL (ref 8–27)
Bilirubin Total: 0.6 mg/dL (ref 0.0–1.2)
CO2: 18 mmol/L — ABNORMAL LOW (ref 20–29)
Calcium: 9.5 mg/dL (ref 8.7–10.3)
Chloride: 103 mmol/L (ref 96–106)
Creatinine, Ser: 0.89 mg/dL (ref 0.57–1.00)
Globulin, Total: 2.4 g/dL (ref 1.5–4.5)
Glucose: 127 mg/dL — ABNORMAL HIGH (ref 70–99)
Potassium: 4.7 mmol/L (ref 3.5–5.2)
Sodium: 145 mmol/L — ABNORMAL HIGH (ref 134–144)
Total Protein: 7 g/dL (ref 6.0–8.5)
eGFR: 67 mL/min/{1.73_m2}

## 2021-09-17 LAB — LIPID PANEL
Chol/HDL Ratio: 3.6 ratio (ref 0.0–4.4)
Cholesterol, Total: 199 mg/dL (ref 100–199)
HDL: 55 mg/dL
LDL Chol Calc (NIH): 115 mg/dL — ABNORMAL HIGH (ref 0–99)
Triglycerides: 164 mg/dL — ABNORMAL HIGH (ref 0–149)
VLDL Cholesterol Cal: 29 mg/dL (ref 5–40)

## 2021-09-17 LAB — MICROALBUMIN / CREATININE URINE RATIO
Creatinine, Urine: 113.7 mg/dL
Microalb/Creat Ratio: 6 mg/g{creat} (ref 0–29)
Microalbumin, Urine: 7.1 ug/mL

## 2021-09-17 LAB — TSH: TSH: 2.01 u[IU]/mL (ref 0.450–4.500)

## 2021-09-17 LAB — HEPATITIS C ANTIBODY: Hep C Virus Ab: 0.2 {s_co_ratio} (ref 0.0–0.9)

## 2021-09-18 NOTE — Addendum Note (Signed)
Addended by: Myles Gip on: 09/18/2021 11:27 AM   Modules accepted: Orders

## 2021-09-19 ENCOUNTER — Ambulatory Visit: Payer: Medicare Other | Admitting: Family Medicine

## 2021-09-19 ENCOUNTER — Telehealth: Payer: Self-pay

## 2021-09-19 DIAGNOSIS — R768 Other specified abnormal immunological findings in serum: Secondary | ICD-10-CM

## 2021-09-19 DIAGNOSIS — R7689 Other specified abnormal immunological findings in serum: Secondary | ICD-10-CM

## 2021-09-19 NOTE — Telephone Encounter (Signed)
Patient advised. She wants a copy of her lab results.Lab requisition ordered for RNA.

## 2021-09-19 NOTE — Telephone Encounter (Signed)
-----   Message from Myles Gip, DO sent at 09/18/2021 11:27 AM EST ----- Hep C testing is indeterminate. We will follow up with RNA testing. She can come by for a lab appt at her convenience.  Her cholesterol is elevated, continue statin as tolerated and focus on avoiding fatty, fried, overly sugary and processed foods.  Other labs stable.

## 2021-09-20 LAB — HCV RNA QUANT: Hepatitis C Quantitation: NOT DETECTED [IU]/mL

## 2021-09-30 ENCOUNTER — Ambulatory Visit: Payer: Self-pay

## 2021-09-30 ENCOUNTER — Telehealth: Payer: Self-pay

## 2021-09-30 NOTE — Telephone Encounter (Signed)
Patient called in requesting antibiotic for head congestion and to prevent pneumonia. I advised patient to go to [facility] for evaluation and treatment. Patient agreed.

## 2021-09-30 NOTE — Telephone Encounter (Signed)
°  Chief Complaint: URI symptoms Symptoms: congestion, runny nose, ears stopped up, cough, fever Frequency: 1-2 days Pertinent Negatives: NA Disposition: [] ED /[x] Urgent Care (no appt availability in office) / [] Appointment(In office/virtual)/ []  [place]/ [] Home Care/ [] Refused Recommended Disposition /[] [place] [facility]/ []  Follow-up with PCP Additional Notes: Pt was advised there are no appts in office until 10/05/21. Advised pt UC d/t not willing to do virtual visit and pt states she wasn't going to walk in clinic or go in ED because she knows shell get worse. Called and spoke with [facility], [place] who was conference in to call and spoke with pt.    Reason for Disposition  [1] Fever returns after gone for over 24 hours AND [2] symptoms worse or not improved  Answer Assessment - Initial Assessment Questions 2. ONSET: "When did the sinus pain start?"  (e.g., hours, days)      yesterday 3. SEVERITY: "How bad is the pain?"   (Scale 1-10; mild, moderate or severe)   - MILD (1-3): doesn't interfere with normal activities    - MODERATE (4-7): interferes with normal activities (e.g., work or school) or awakens from sleep   - SEVERE (8-10): excruciating pain and patient unable to do any normal activities        moderate 4. RECURRENT SYMPTOM: "Have you ever had sinus problems before?" If Yes, ask: "When was the last time?" and "What happened that time?"      yes 5. NASAL CONGESTION: "Is the nose blocked?" If Yes, ask: "Can you open it or must you breathe through your mouth?"     yes 6. NASAL DISCHARGE: "Do you have discharge from your nose?" If so ask, "What color?"     yes 7. FEVER: "Do you have a fever?" If Yes, ask: "What is it, how was it measured, and when did it start?"      yes 8. OTHER SYMPTOMS: "Do you have any other symptoms?" (e.g., sore throat, cough, earache, difficulty breathing)     Sore throat, ears stopped up  Protocols used: Sinus Pain or Congestion-A-AH

## 2021-09-30 NOTE — Telephone Encounter (Signed)
Pt called, left VM to call back and speak to a nurse about symptoms. Was disconnected before transferring.

## 2021-10-20 LAB — HM DIABETES EYE EXAM

## 2021-11-22 ENCOUNTER — Telehealth: Payer: Self-pay

## 2021-11-22 NOTE — Telephone Encounter (Signed)
Copied from [place] [id number]. Topic: Referral - Status >> Nov 21, 2021  5:16 PM Yvette Rack wrote: Reason for CRM: Pt stated she needs to know which doctor she was referred to for her foot. Pt requests call back.

## 2021-11-23 ENCOUNTER — Encounter: Payer: Self-pay | Admitting: Family Medicine

## 2021-12-27 NOTE — Progress Notes (Signed)
?  ? ? ?Established patient visit ? ?I,April Miller,acting as a scribe for Wilhemena Durie, MD.,have documented all relevant documentation on the behalf of Wilhemena Durie, MD,as directed by  Wilhemena Durie, MD while in the presence of Wilhemena Durie, MD. ? ? ?Patient: Wendy Beck   DOB: 1945-11-04   76 y.o. Female  MRN: [id number] ?Visit Date: 12/28/2021 ? ?Today's healthcare provider: Wilhemena Durie, MD  ? ?Chief Complaint  ?Patient presents with  ? Follow-up  ? Diabetes  ? Hypertension  ? ?Subjective  ?  ?HPI  ?Very anxious lady comes in today for follow-up of chronic medical problems.  She wants her prescriptions printed out as she shops for pricing on all her prescription refills.  She states she has always gotten lipids and liver lab work done every time she has had a doctor's appointment in the past.  She has no complaints today other than her chronic issues.  She and her husband are getting ready to go on a nice trip. ?Diabetes Mellitus Type II, follow-up ? ?Lab Results  ?Component Value Date  ? HGBA1C 6.3 (A) 12/28/2021  ? HGBA1C 6.6 (A) 09/14/2021  ? HGBA1C 6.8 (H) 04/25/2021  ? ?Last seen for diabetes 3 months ago.  ?Management since then includes continuing the same treatment. ?She reports good compliance with treatment. ?She is not having side effects. none ? ?Home blood sugar records: fasting range: not checking ? ?Episodes of hypoglycemia? No none ?  ?Current insulin regiment: n/a ?Most Recent Eye Exam: 10/20/2021 ? ?--------------------------------------------------------------------------------------------------- ?Hypertension, follow-up ? ?BP Readings from Last 3 Encounters:  ?12/28/21 (!) 185/80  ?09/14/21 (!) 154/74  ?04/25/21 (!) 154/73  ? Wt Readings from Last 3 Encounters:  ?12/28/21 179 lb (81.2 kg)  ?09/14/21 179 lb (81.2 kg)  ?06/20/21 182 lb (82.6 kg)  ?  ? ?She was last seen for hypertension 3 months ago.  ?BP at that visit was 154/74. ?Management since that visit  includes; on bisoprolol-HCTZ, losartan. ?She reports good compliance with treatment. ?She is not having side effects. none ?She is exercising. ?She is adherent to low salt diet.   ?Outside blood pressures are not checking. ? ?She does not smoke. ? ?Use of agents associated with hypertension: none.  ? ?--------------------------------------------------------------------------------------------------- ? ? ?Medications: ?Outpatient Medications Prior to Visit  ?Medication Sig  ? b complex vitamins tablet Take 1 tablet by mouth daily with lunch.  ? cholecalciferol (VITAMIN D) 1000 units tablet Take 2,000 Units by mouth daily with lunch.   ? Coenzyme Q10 300 MG WAFR Take 300 mg by mouth daily with lunch.  ? Cyanocobalamin (VITAMIN B-12) 5000 MCG SUBL Place under the tongue daily.  ? diclofenac Sodium (VOLTAREN) 1 % GEL Apply 4 g topically 4 (four) times daily. As needed  ? Glucosamine Sulfate (DONA PO) Take 2 tablets by mouth daily with lunch.   ? Multiple Vitamins-Minerals (HAIR VITAMINS PO) Take 2 tablets by mouth daily with lunch.   ? Omega-3 Fatty Acids (FISH OIL ULTRA) 1400 MG CAPS Take 4,200 mg by mouth daily at 2 PM.  ? potassium chloride (KLOR-CON) 10 MEQ tablet Take 10 mEq by mouth daily. As needed  ? [DISCONTINUED] atorvastatin (LIPITOR) 40 MG tablet Take 1 tablet (40 mg total) by mouth daily.  ? [DISCONTINUED] bisoprolol-hydrochlorothiazide (ZIAC) 5-6.25 MG tablet Take 1 tablet by mouth daily.  ? [DISCONTINUED] fexofenadine-pseudoephedrine (ALLEGRA-D) 60-120 MG 12 hr tablet Take 1 tablet by mouth 2 (two) times daily.   ? [DISCONTINUED] levothyroxine (  SYNTHROID) 100 MCG tablet Take 1 tablet (100 mcg total) by mouth daily before breakfast.  ? [DISCONTINUED] losartan (COZAAR) 100 MG tablet Take 1 tablet (100 mg total) by mouth daily.  ? [DISCONTINUED] metFORMIN (GLUCOPHAGE-XR) 500 MG 24 hr tablet Take 2 tablets by mouth once daily  ? [DISCONTINUED] modafinil (PROVIGIL) 200 MG tablet Take 1 tablet (200 mg total) by  mouth daily.  ? ?No facility-administered medications prior to visit.  ? ? ?Review of Systems  ?Constitutional:  Negative for appetite change, chills, fatigue and fever.  ?Respiratory:  Negative for chest tightness and shortness of breath.   ?Cardiovascular:  Negative for chest pain and palpitations.  ?Gastrointestinal:  Negative for abdominal pain, nausea and vomiting.  ?Neurological:  Negative for dizziness and weakness.  ? ?Last hemoglobin A1c ?Lab Results  ?Component Value Date  ? HGBA1C 6.3 (A) 12/28/2021  ? ?  ?  Objective  ?  ?BP (!) 185/80 (BP Location: Right Arm, Patient Position: Sitting, Cuff Size: Large)   Pulse 66   Temp 98.5 ?F (36.9 ?C) (Temporal)   Resp 16   Ht _0  (1.6 m)   Wt 179 lb (81.2 kg)   SpO2 99%   BMI 31.71 kg/m?  ?BP Readings from Last 3 Encounters:  ?12/28/21 (!) 185/80  ?09/14/21 (!) 154/74  ?04/25/21 (!) 154/73  ? ?Wt Readings from Last 3 Encounters:  ?12/28/21 179 lb (81.2 kg)  ?09/14/21 179 lb (81.2 kg)  ?06/20/21 182 lb (82.6 kg)  ? ?  ? ?Physical Exam ?Vitals reviewed.  ?Constitutional:   ?   Appearance: Normal appearance.  ?HENT:  ?   Head: Normocephalic and atraumatic.  ?   Right Ear: External ear normal.  ?   Left Ear: External ear normal.  ?Eyes:  ?   General: No scleral icterus. ?   Conjunctiva/sclera: Conjunctivae normal.  ?Cardiovascular:  ?   Rate and Rhythm: Normal rate and regular rhythm.  ?   Pulses: Normal pulses.  ?   Heart sounds: Normal heart sounds.  ?Pulmonary:  ?   Effort: Pulmonary effort is normal.  ?   Breath sounds: Normal breath sounds.  ?Abdominal:  ?   Palpations: Abdomen is soft.  ?Musculoskeletal:  ?   Right lower leg: No edema.  ?   Left lower leg: No edema.  ?Skin: ?   General: Skin is warm and dry.  ?Neurological:  ?   General: No focal deficit present.  ?   Mental Status: She is alert and oriented to person, place, and time.  ?Psychiatric:     ?   Mood and Affect: Mood normal.     ?   Behavior: Behavior normal.  ?  ? ? ?Results for orders  placed or performed in visit on 12/28/21  ?POCT glycosylated hemoglobin (Hb A1C)  ?Result Value Ref Range  ? Hemoglobin A1C 6.3 (A) 4.0 - 5.6 %  ? Est. average glucose Bld gHb Est-mCnc 134   ? ? Assessment & Plan  ?  ? ?1. Type 2 diabetes mellitus with other circulatory complication, without [place]-term current use of insulin ([place]) ?A1c is improved from 6.6-6.3.  She is on no medicine that can cause hypoglycemia. ?- POCT glycosylated hemoglobin (Hb A1C)--6.3 today. ?- metFORMIN (GLUCOPHAGE-XR) 500 MG 24 hr tablet; Take 2 tablets by mouth once daily  Dispense: 180 tablet; Refill: 3 ?- blood glucose meter kit and supplies; Dispense based on patient and insurance preference. Use Once daily as directed. (FOR ICD-10 E10.59).  Dispense:  1 each; Refill: 0 ? ?2. Essential hypertension with goal blood pressure less than 140/90 ?Added HCTZ 12.5 daily. ?- bisoprolol-hydrochlorothiazide (ZIAC) 5-6.25 MG tablet; Take 1 tablet by mouth daily.  Dispense: 90 tablet; Refill: 3 ?- losartan (COZAAR) 100 MG tablet; Take 1 tablet (100 mg total) by mouth daily.  Dispense: 90 tablet; Refill: 3 ?- hydrochlorothiazide (HYDRODIURIL) 12.5 MG tablet; Take 1 tablet (12.5 mg total) by mouth daily.  Dispense: 90 tablet; Refill: 3 ?- Hepatic function panel ? ?3. Hyperlipidemia, unspecified hyperlipidemia type ?Patient does not need lab work today but she insists she wants it done.  Lipids and hepatic. ?- atorvastatin (LIPITOR) 40 MG tablet; Take 1 tablet (40 mg total) by mouth daily.  Dispense: 90 tablet; Refill: 3 ?- Hepatic function panel ?- Lipid panel ? ?4. Hypothyroidism, unspecified type ? ?- levothyroxine (SYNTHROID) 100 MCG tablet; Take 1 tablet (100 mcg total) by mouth daily before breakfast.  Dispense: 90 tablet; Refill: 3 ? ?5. Primary narcolepsy without cataplexy ?We will quickly refer to neurology going forward if she has any issues with this. ?- modafinil (PROVIGIL) 200 MG tablet; Take 1 tablet (200 mg total) by mouth daily.  Dispense:  90 tablet; Refill: 1 ? ?6. Acute non-recurrent frontal sinusitis ? ?- azithromycin (ZITHROMAX) 250 MG tablet; Take 2 tablets on day 1, then 1 tablet daily on days 2 through 5  Dispense: 6 tablet; Refill: 0 ? ?7. All

## 2021-12-28 ENCOUNTER — Ambulatory Visit (INDEPENDENT_AMBULATORY_CARE_PROVIDER_SITE_OTHER): Payer: Medicare Other | Admitting: Family Medicine

## 2021-12-28 ENCOUNTER — Encounter: Payer: Self-pay | Admitting: Family Medicine

## 2021-12-28 ENCOUNTER — Other Ambulatory Visit: Payer: Self-pay

## 2021-12-28 VITALS — BP 185/80 | HR 66 | Temp 98.5°F | Resp 16 | Ht 63.0 in | Wt 179.0 lb

## 2021-12-28 DIAGNOSIS — F419 Anxiety disorder, unspecified: Secondary | ICD-10-CM | POA: Diagnosis not present

## 2021-12-28 DIAGNOSIS — J0110 Acute frontal sinusitis, unspecified: Secondary | ICD-10-CM | POA: Diagnosis not present

## 2021-12-28 DIAGNOSIS — E785 Hyperlipidemia, unspecified: Secondary | ICD-10-CM

## 2021-12-28 DIAGNOSIS — G47419 Narcolepsy without cataplexy: Secondary | ICD-10-CM

## 2021-12-28 DIAGNOSIS — E039 Hypothyroidism, unspecified: Secondary | ICD-10-CM

## 2021-12-28 DIAGNOSIS — E1159 Type 2 diabetes mellitus with other circulatory complications: Secondary | ICD-10-CM | POA: Diagnosis not present

## 2021-12-28 DIAGNOSIS — J309 Allergic rhinitis, unspecified: Secondary | ICD-10-CM | POA: Diagnosis not present

## 2021-12-28 DIAGNOSIS — I10 Essential (primary) hypertension: Secondary | ICD-10-CM

## 2021-12-28 LAB — POCT GLYCOSYLATED HEMOGLOBIN (HGB A1C)
Est. average glucose Bld gHb Est-mCnc: 134
Hemoglobin A1C: 6.3 % — AB (ref 4.0–5.6)

## 2021-12-28 MED ORDER — HYDROCHLOROTHIAZIDE 12.5 MG PO TABS: 12.5000 mg | ORAL_TABLET | Freq: Every day | ORAL | 3 refills | Status: DC

## 2021-12-28 MED ORDER — BLOOD GLUCOSE METER KIT: PACK | 0 refills | Status: AC

## 2021-12-28 MED ORDER — BISOPROLOL-HYDROCHLOROTHIAZIDE 5-6.25 MG PO TABS: 1.0000 | ORAL_TABLET | Freq: Every day | ORAL | 3 refills | Status: DC

## 2021-12-28 MED ORDER — FEXOFENADINE-PSEUDOEPHED ER 60-120 MG PO TB12: 1.0000 | ORAL_TABLET | Freq: Two times a day (BID) | ORAL | 5 refills | Status: AC

## 2021-12-28 MED ORDER — ATORVASTATIN CALCIUM 40 MG PO TABS: 40.0000 mg | ORAL_TABLET | Freq: Every day | ORAL | 3 refills | Status: DC

## 2021-12-28 MED ORDER — METFORMIN HCL ER 500 MG PO TB24: ORAL_TABLET | ORAL | 3 refills | Status: DC

## 2021-12-28 MED ORDER — MODAFINIL 200 MG PO TABS: 200.0000 mg | ORAL_TABLET | Freq: Every day | ORAL | 1 refills | Status: DC

## 2021-12-28 MED ORDER — LOSARTAN POTASSIUM 100 MG PO TABS: 100.0000 mg | ORAL_TABLET | Freq: Every day | ORAL | 3 refills | Status: DC

## 2021-12-28 MED ORDER — LEVOTHYROXINE SODIUM 100 MCG PO TABS: 100.0000 ug | ORAL_TABLET | Freq: Every day | ORAL | 3 refills | Status: DC

## 2021-12-28 MED ORDER — AZITHROMYCIN 250 MG PO TABS: ORAL_TABLET | ORAL | 0 refills | Status: AC

## 2021-12-29 LAB — LIPID PANEL
Chol/HDL Ratio: 3.6 ratio (ref 0.0–4.4)
Cholesterol, Total: 150 mg/dL (ref 100–199)
HDL: 42 mg/dL
LDL Chol Calc (NIH): 94 mg/dL (ref 0–99)
Triglycerides: 71 mg/dL (ref 0–149)
VLDL Cholesterol Cal: 14 mg/dL (ref 5–40)

## 2021-12-29 LAB — HEPATIC FUNCTION PANEL
ALT: 35 [IU]/L — ABNORMAL HIGH (ref 0–32)
AST: 26 [IU]/L (ref 0–40)
Albumin: 4.2 g/dL (ref 3.7–4.7)
Alkaline Phosphatase: 80 [IU]/L (ref 44–121)
Bilirubin Total: 0.7 mg/dL (ref 0.0–1.2)
Bilirubin, Direct: 0.24 mg/dL (ref 0.00–0.40)
Total Protein: 6.7 g/dL (ref 6.0–8.5)

## 2022-02-22 IMAGING — CR DG CHEST 2V
1 series · 2 of 2 positions shown · non-contrast
Comparison: None.

CLINICAL DATA: Cough. Recent IL906-QE positive. Shortness of breath

EXAM:
CHEST - 2 VIEW

[Series 1: dg chest 2 view · 0.14mm/px · 2 of 2 slices shown]
[im 1/2]
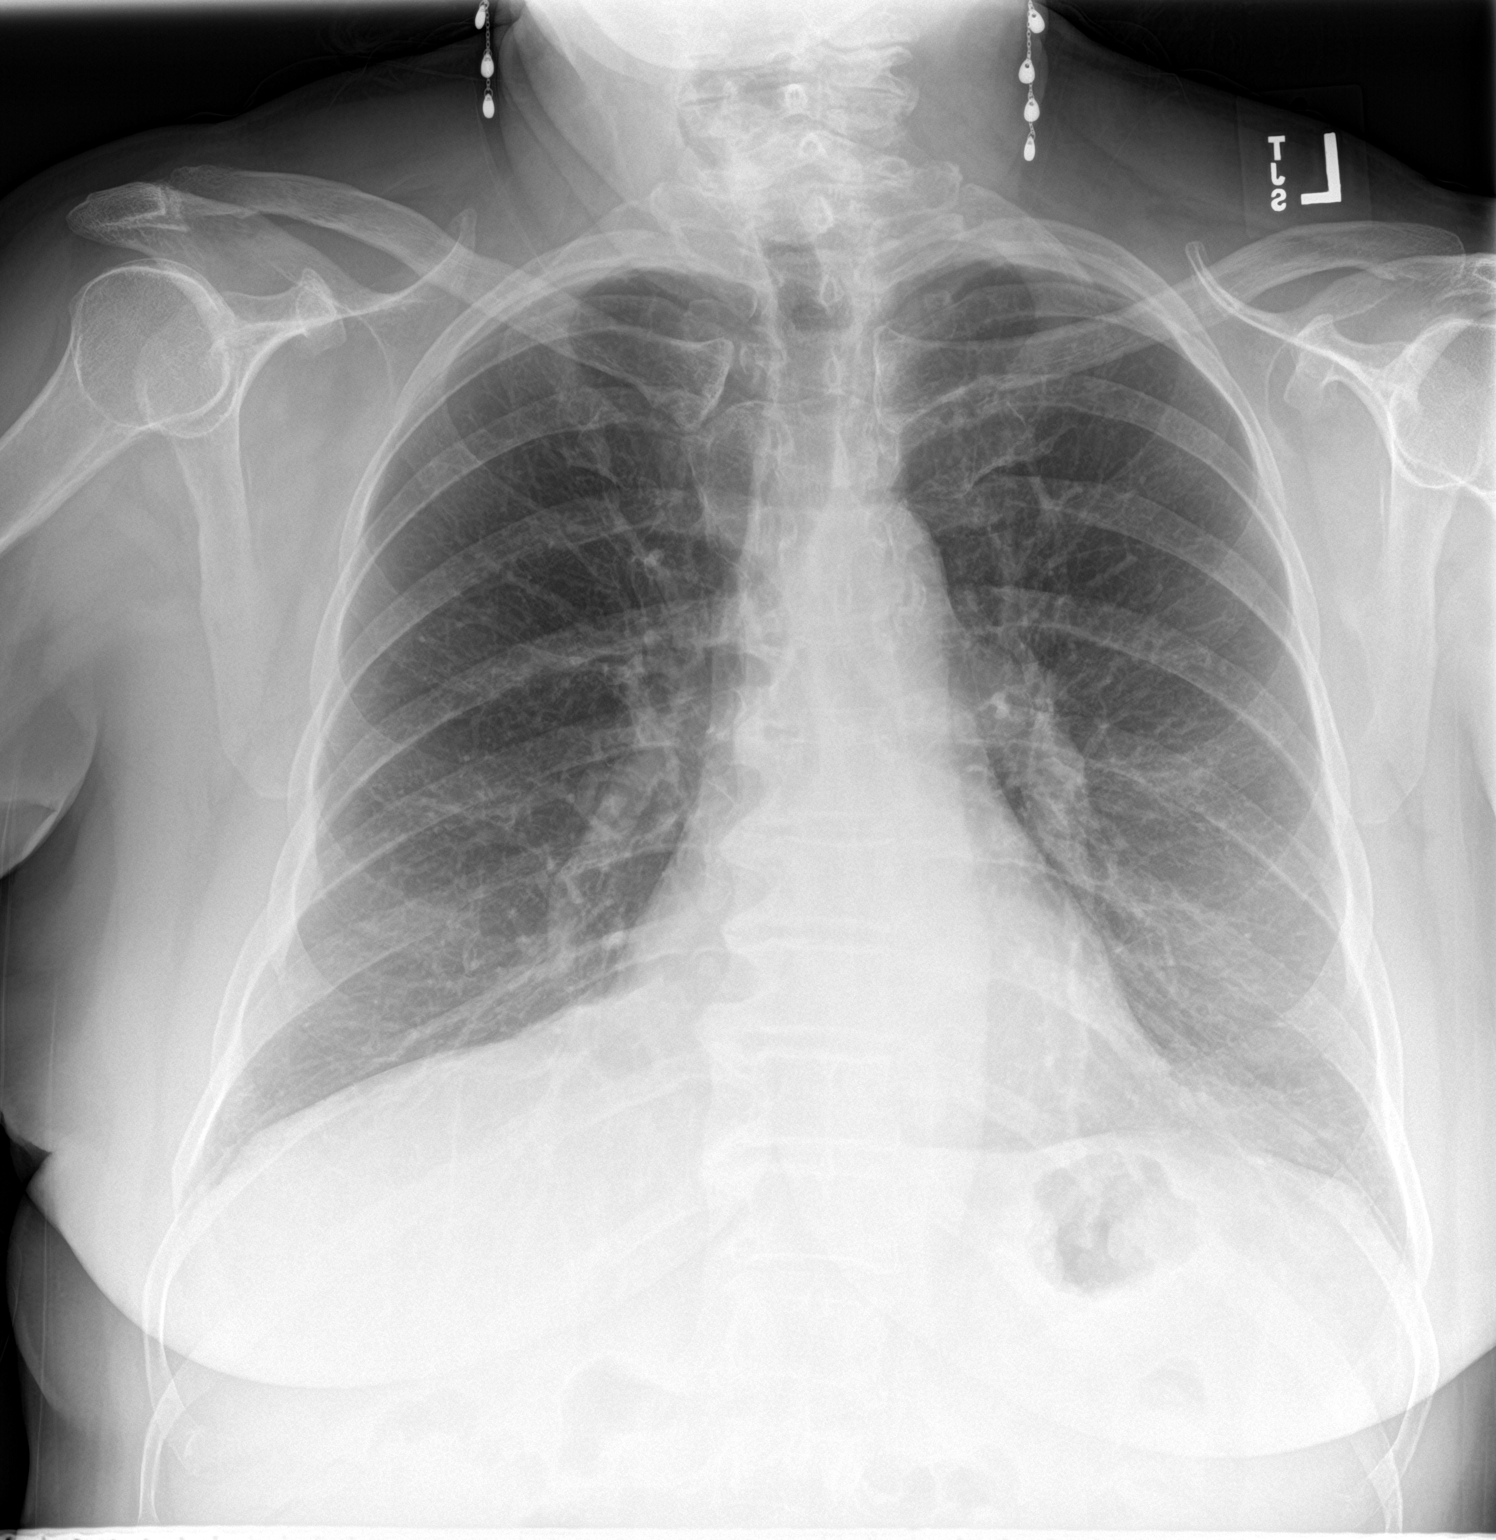
[im 2/2]
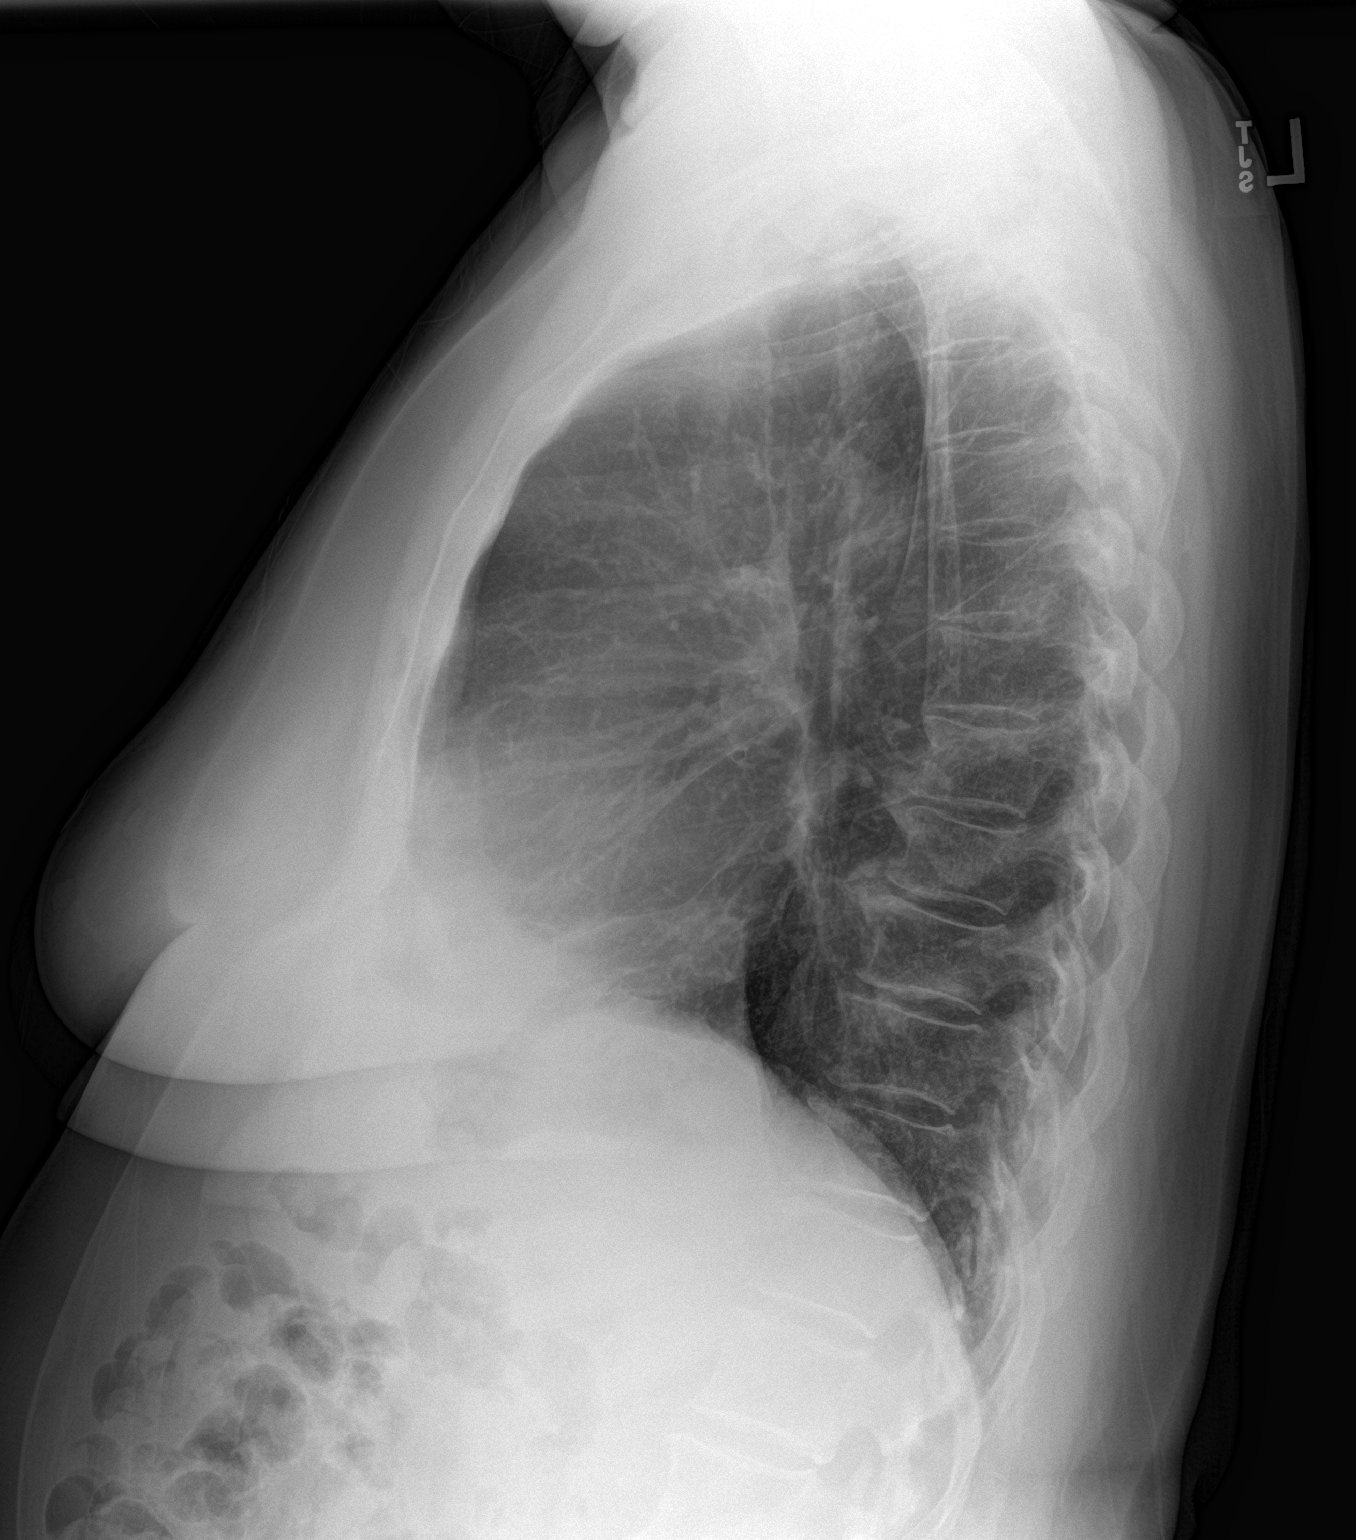

[2 of 2 positions shown; findings below may reference images not displayed]

FINDINGS: Lungs are clear. Heart size and pulmonary vascularity are normal. No
adenopathy. There is degenerative change in the thoracic spine.
IMPRESSION: Lungs clear.  Cardiac silhouette normal.

## 2022-03-07 LAB — HM MAMMOGRAPHY

## 2022-04-25 NOTE — Progress Notes (Unsigned)
[place] P[place],acting as a scribe for Wilhemena Durie, MD.,have documented all relevant documentation on the behalf of Wilhemena Durie, MD,as directed by  Wilhemena Durie, MD while in the presence of Wilhemena Durie, MD.    Annual Wellness Visit     Patient: Wendy Beck, Female    DOB: 02/27/1946, 76 y.o.   MRN: [id number] Visit Date: 04/26/2022  Today's Provider: Wilhemena Durie, MD   No chief complaint on file.  Subjective    Wendy Beck is a 76 y.o. female who presents today for her Annual Wellness Visit. She reports consuming a {diet types:17450} diet. {Exercise:19826} She generally feels {well/fairly well/poorly:18703}. She reports sleeping {well/fairly well/poorly:18703}. She {does/does not:200015} have additional problems to discuss today.   HPI    Medications: Outpatient Medications Prior to Visit  Medication Sig   atorvastatin (LIPITOR) 40 MG tablet Take 1 tablet (40 mg total) by mouth daily.   b complex vitamins tablet Take 1 tablet by mouth daily with lunch.   bisoprolol-hydrochlorothiazide (ZIAC) 5-6.25 MG tablet Take 1 tablet by mouth daily.   blood glucose meter kit and supplies Dispense based on patient and insurance preference. Use Once daily as directed. (FOR ICD-10 E10.59).   cholecalciferol (VITAMIN D) 1000 units tablet Take 2,000 Units by mouth daily with lunch.    Coenzyme Q10 300 MG WAFR Take 300 mg by mouth daily with lunch.   Cyanocobalamin (VITAMIN B-12) 5000 MCG SUBL Place under the tongue daily.   diclofenac Sodium (VOLTAREN) 1 % GEL Apply 4 g topically 4 (four) times daily. As needed   fexofenadine-pseudoephedrine (ALLEGRA-D) 60-120 MG 12 hr tablet Take 1 tablet by mouth 2 (two) times daily.   Glucosamine Sulfate (DONA PO) Take 2 tablets by mouth daily with lunch.    hydrochlorothiazide (HYDRODIURIL) 12.5 MG tablet Take 1 tablet (12.5 mg total) by mouth daily.   levothyroxine (SYNTHROID) 100 MCG tablet Take 1 tablet (100 mcg  total) by mouth daily before breakfast.   losartan (COZAAR) 100 MG tablet Take 1 tablet (100 mg total) by mouth daily.   metFORMIN (GLUCOPHAGE-XR) 500 MG 24 hr tablet Take 2 tablets by mouth once daily   modafinil (PROVIGIL) 200 MG tablet Take 1 tablet (200 mg total) by mouth daily.   Multiple Vitamins-Minerals (HAIR VITAMINS PO) Take 2 tablets by mouth daily with lunch.    Omega-3 Fatty Acids (FISH OIL ULTRA) 1400 MG CAPS Take 4,200 mg by mouth daily at 2 PM.   potassium chloride (KLOR-CON) 10 MEQ tablet Take 10 mEq by mouth daily. As needed   No facility-administered medications prior to visit.    Allergies  Allergen Reactions   Niacin And Related Hives    flushing   Ezetimibe Other (See Comments)    Increased lft     Statins Other (See Comments)    Muscle aches Increased lft     Patient Care Team: Jerrol Banana., MD as PCP - General (Family Medicine) Marry Guan, Laurice Record, MD (Orthopedic Surgery) Ralene Bathe, MD (Dermatology) Rodney Booze as Physician Assistant  Review of Systems  Constitutional: Negative.   HENT: Negative.    Eyes: Negative.   Respiratory: Negative.    Cardiovascular: Negative.   Gastrointestinal: Negative.   Endocrine: Negative.   Genitourinary: Negative.   Musculoskeletal: Negative.   Skin: Negative.   Allergic/Immunologic: Negative.   Neurological: Negative.   Hematological: Negative.   Psychiatric/Behavioral: Negative.      {Labs  Heme  Chem  Endocrine  Serology  Results Review (optional):23779}    Objective    Vitals: There were no vitals taken for this visit. {Show previous vital signs (optional):23777}   Physical Exam Constitutional:      Appearance: Normal appearance. She is normal weight.  HENT:     Head: Normocephalic and atraumatic.     Right Ear: Tympanic membrane, ear canal and external ear normal.     Left Ear: Tympanic membrane, ear canal and external ear normal.     Nose: Nose normal.      Mouth/Throat:     Mouth: Mucous membranes are moist.     Pharynx: Oropharynx is clear.  Eyes:     Extraocular Movements: Extraocular movements intact.     Conjunctiva/sclera: Conjunctivae normal.     Pupils: Pupils are equal, round, and reactive to light.  Cardiovascular:     Rate and Rhythm: Normal rate and regular rhythm.     Pulses: Normal pulses.     Heart sounds: Normal heart sounds.  Pulmonary:     Effort: Pulmonary effort is normal.     Breath sounds: Normal breath sounds.  Abdominal:     General: Abdomen is flat. Bowel sounds are normal.     Palpations: Abdomen is soft.  Musculoskeletal:        General: Normal range of motion.     Cervical back: Normal range of motion and neck supple.  Skin:    General: Skin is warm and dry.  Neurological:     General: No focal deficit present.     Mental Status: She is alert and oriented to person, place, and time. Mental status is at baseline.  Psychiatric:        Mood and Affect: Mood normal.        Behavior: Behavior normal.        Thought Content: Thought content normal.        Judgment: Judgment normal.    ***  Most recent functional status assessment:    12/28/2021    9:18 AM  In your present state of health, do you have any difficulty performing the following activities:  Hearing? 0  Vision? 1  Difficulty concentrating or making decisions? 1  Walking or climbing stairs? 1  Dressing or bathing? 1  Doing errands, shopping? 0   Most recent fall risk assessment:    12/28/2021    9:17 AM  Fall Risk   Falls in the past year? 1  Number falls in past yr: 1  Comment 3  Injury with Fall? 0  Risk for fall due to : History of fall(s)  Follow up Falls evaluation completed    Most recent depression screenings:    12/28/2021    9:18 AM 09/14/2021    8:52 AM  PHQ 2/9 Scores  PHQ - 2 Score 0 1  PHQ- 9 Score 5 6   Most recent cognitive screening:     No data to display         Most recent Audit-C alcohol use  screening    12/28/2021    9:17 AM  Alcohol Use Disorder Test (AUDIT)  1. How often do you have a drink containing alcohol? 0  2. How many drinks containing alcohol do you have on a typical day when you are drinking? 0  3. How often do you have six or more drinks on one occasion? 0  AUDIT-C Score 0   A score of 3 or more in women, and  4 or more in men indicates increased risk for alcohol abuse, EXCEPT if all of the points are from question 1   No results found for any visits on 04/26/22.  Assessment & Plan     Annual wellness visit done today including the all of the following: Reviewed patient's Family Medical History Reviewed and updated list of patient's medical providers Assessment of cognitive impairment was done Assessed patient's functional ability Established a written schedule for health screening [place] Completed and Reviewed  Exercise Activities and Dietary recommendations  Goals      Prevent falls     Recommend to remove any items from the home that may cause slips or trips.        Immunization History  Administered Date(s) Administered   Fluad Quad(high Dose 65+) 08/05/2019, 07/13/2020   Hep A / Hep B 09/16/2019, 10/24/2019, 03/23/2020   Hepatitis A 09/16/2019, 10/24/2019   Hepatitis B 09/16/2019, 10/24/2019   Influenza Split 06/26/2008, 06/21/2009, 08/12/2010   Influenza, High Dose Seasonal PF 06/19/2014, 07/02/2017, 08/14/2017, 06/26/2018   Influenza,inj,Quad PF,6+ Mos 07/08/2012   Influenza,inj,quad, With Preservative 07/12/2015   Influenza-Unspecified 07/12/2015   PFIZER(Purple Top)SARS-COV-2 Vaccination 11/05/2019, 12/09/2019   Pneumococcal Conjugate-13 01/20/2015   Pneumococcal Polysaccharide-23 08/14/2011   Td 06/25/1998, 09/20/2009   Tdap 11/12/2014    Health Maintenance  Topic Date Due   Zoster Vaccines- Shingrix (1 of 2) Never done   COVID-19 Vaccine (3 - Pfizer risk series) 01/06/2020   FOOT EXAM  12/13/2021    INFLUENZA VACCINE  05/02/2022   HEMOGLOBIN A1C  06/30/2022   Diabetic kidney evaluation - GFR measurement  09/14/2022   Diabetic kidney evaluation - Urine ACR  09/14/2022   OPHTHALMOLOGY EXAM  10/20/2022   DEXA SCAN  07/23/2024   TETANUS/TDAP  11/12/2024   Pneumonia Vaccine 76+ Years old  Completed   Hepatitis C Screening  Completed   HPV VACCINES  Aged Out     Discussed health benefits of physical activity, and encouraged her to engage in regular exercise appropriate for her age and condition.    ***  No follow-ups on file.     {provider attestation***:1}   Wilhemena Durie, MD  [facility] [phone] (phone) [phone] (fax)  [place]

## 2022-04-26 ENCOUNTER — Ambulatory Visit (INDEPENDENT_AMBULATORY_CARE_PROVIDER_SITE_OTHER): Payer: Medicare Other | Admitting: Family Medicine

## 2022-04-26 VITALS — BP 127/58 | HR 63 | Temp 98.2°F | Wt 179.0 lb

## 2022-04-26 DIAGNOSIS — R319 Hematuria, unspecified: Secondary | ICD-10-CM | POA: Diagnosis not present

## 2022-04-26 DIAGNOSIS — J309 Allergic rhinitis, unspecified: Secondary | ICD-10-CM | POA: Diagnosis not present

## 2022-04-26 DIAGNOSIS — Z0000 Encounter for general adult medical examination without abnormal findings: Secondary | ICD-10-CM | POA: Diagnosis not present

## 2022-04-26 DIAGNOSIS — F419 Anxiety disorder, unspecified: Secondary | ICD-10-CM

## 2022-04-26 DIAGNOSIS — E039 Hypothyroidism, unspecified: Secondary | ICD-10-CM

## 2022-04-26 DIAGNOSIS — E1159 Type 2 diabetes mellitus with other circulatory complications: Secondary | ICD-10-CM

## 2022-04-26 DIAGNOSIS — I10 Essential (primary) hypertension: Secondary | ICD-10-CM | POA: Diagnosis not present

## 2022-04-26 DIAGNOSIS — E119 Type 2 diabetes mellitus without complications: Secondary | ICD-10-CM

## 2022-04-26 DIAGNOSIS — E785 Hyperlipidemia, unspecified: Secondary | ICD-10-CM

## 2022-04-26 DIAGNOSIS — F32A Depression, unspecified: Secondary | ICD-10-CM

## 2022-04-26 DIAGNOSIS — G47419 Narcolepsy without cataplexy: Secondary | ICD-10-CM | POA: Diagnosis not present

## 2022-04-27 LAB — LIPID PANEL WITH LDL/HDL RATIO
Cholesterol, Total: 162 mg/dL (ref 100–199)
HDL: 42 mg/dL
LDL Chol Calc (NIH): 99 mg/dL (ref 0–99)
LDL/HDL Ratio: 2.4 ratio (ref 0.0–3.2)
Triglycerides: 116 mg/dL (ref 0–149)
VLDL Cholesterol Cal: 21 mg/dL (ref 5–40)

## 2022-04-27 LAB — CBC WITH DIFFERENTIAL/PLATELET
Basophils Absolute: 0 10*3/uL (ref 0.0–0.2)
Basos: 1 %
EOS (ABSOLUTE): 0.2 10*3/uL (ref 0.0–0.4)
Eos: 4 %
Hematocrit: 38.2 % (ref 34.0–46.6)
Hemoglobin: 13 g/dL (ref 11.1–15.9)
Immature Grans (Abs): 0 10*3/uL (ref 0.0–0.1)
Immature Granulocytes: 0 %
Lymphocytes Absolute: 3 10*3/uL (ref 0.7–3.1)
Lymphs: 51 %
MCH: 30.7 pg (ref 26.6–33.0)
MCHC: 34 g/dL (ref 31.5–35.7)
MCV: 90 fL (ref 79–97)
Monocytes Absolute: 0.4 10*3/uL (ref 0.1–0.9)
Monocytes: 7 %
Neutrophils Absolute: 2.2 10*3/uL (ref 1.4–7.0)
Neutrophils: 37 %
Platelets: 178 10*3/uL (ref 150–450)
RBC: 4.23 x10E6/uL (ref 3.77–5.28)
RDW: 12.5 % (ref 11.7–15.4)
WBC: 5.9 10*3/uL (ref 3.4–10.8)

## 2022-04-27 LAB — COMPREHENSIVE METABOLIC PANEL WITH GFR
ALT: 44 [IU]/L — ABNORMAL HIGH (ref 0–32)
AST: 29 [IU]/L (ref 0–40)
Albumin/Globulin Ratio: 1.6 (ref 1.2–2.2)
Albumin: 4.2 g/dL (ref 3.8–4.8)
Alkaline Phosphatase: 90 [IU]/L (ref 44–121)
BUN/Creatinine Ratio: 19 (ref 12–28)
BUN: 21 mg/dL (ref 8–27)
Bilirubin Total: 0.7 mg/dL (ref 0.0–1.2)
CO2: 23 mmol/L (ref 20–29)
Calcium: 9.1 mg/dL (ref 8.7–10.3)
Chloride: 103 mmol/L (ref 96–106)
Creatinine, Ser: 1.08 mg/dL — ABNORMAL HIGH (ref 0.57–1.00)
Globulin, Total: 2.7 g/dL (ref 1.5–4.5)
Glucose: 143 mg/dL — ABNORMAL HIGH (ref 70–99)
Potassium: 4 mmol/L (ref 3.5–5.2)
Sodium: 140 mmol/L (ref 134–144)
Total Protein: 6.9 g/dL (ref 6.0–8.5)
eGFR: 53 mL/min/{1.73_m2} — ABNORMAL LOW

## 2022-04-27 LAB — HEMOGLOBIN A1C
Est. average glucose Bld gHb Est-mCnc: 148 mg/dL
Hgb A1c MFr Bld: 6.8 % — ABNORMAL HIGH (ref 4.8–5.6)

## 2022-04-27 LAB — TSH: TSH: 1.33 u[IU]/mL (ref 0.450–4.500)

## 2022-06-09 ENCOUNTER — Other Ambulatory Visit: Payer: Self-pay | Admitting: Family Medicine

## 2022-06-09 DIAGNOSIS — I10 Essential (primary) hypertension: Secondary | ICD-10-CM

## 2022-06-09 NOTE — Telephone Encounter (Signed)
Medication Refill - Medication: hydrochlorothiazide (HYDRODIURIL) 12.5 MG tablet. Patient states she normally gets #90 but she states it will be cheaper using [facility] getting #180 if possible.  Has the patient contacted their pharmacy? Yes.     Preferred Pharmacy (with phone number or street name[id number] [place]  [phone] Fax:  [phone]    Has the patient been seen for an appointment in the last year OR does the patient have an upcoming appointment? Yes.    Please assist patient further

## 2022-06-12 MED ORDER — HYDROCHLOROTHIAZIDE 12.5 MG PO TABS: 12.5000 mg | ORAL_TABLET | Freq: Every day | ORAL | 1 refills | Status: DC

## 2022-06-12 NOTE — Telephone Encounter (Signed)
Requested Prescriptions  Pending Prescriptions Disp Refills  . hydrochlorothiazide (HYDRODIURIL) 12.5 MG tablet 90 tablet 3    Sig: Take 1 tablet (12.5 mg total) by mouth daily.     Cardiovascular: Diuretics - Thiazide Failed - 06/09/2022  1:32 PM      Failed - Cr in normal range and within 180 days    Creatinine, Ser  Date Value Ref Range Status  04/26/2022 1.08 (H) 0.57 - 1.00 mg/dL Final         Passed - K in normal range and within 180 days    Potassium  Date Value Ref Range Status  04/26/2022 4.0 3.5 - 5.2 mmol/L Final         Passed - Na in normal range and within 180 days    Sodium  Date Value Ref Range Status  04/26/2022 140 134 - 144 mmol/L Final         Passed - Last BP in normal range    BP Readings from Last 1 Encounters:  04/26/22 (!) 127/58         Passed - Valid encounter within last 6 months    Recent Outpatient Visits          1 month ago Encounter for [employer] annual wellness exam   [facility] Jerrol Banana., MD   5 months ago Type 2 diabetes mellitus with other circulatory complication, without [place]-term current use of insulin [facility])   [facility] Jerrol Banana., MD   9 months ago Type 2 diabetes mellitus with other circulatory complication, without [place]-term current use of insulin [facility])   [facility]r, Jake Church, DO   1 year ago Encounter for [employer] annual wellness exam   [facility] Jerrol Banana., MD   1 year ago Type 2 diabetes mellitus with other circulatory complication, without [place]-term current use of insulin [facility])   [facility] Jerrol Banana., MD      Future Appointments            In 4 months Jerrol Banana., MD [facility]

## 2022-06-16 ENCOUNTER — Telehealth: Payer: Self-pay | Admitting: Family Medicine

## 2022-06-16 DIAGNOSIS — I10 Essential (primary) hypertension: Secondary | ICD-10-CM

## 2022-06-16 MED ORDER — HYDROCHLOROTHIAZIDE 12.5 MG PO TABS: 12.5000 mg | ORAL_TABLET | Freq: Every day | ORAL | 1 refills | Status: DC

## 2022-06-16 NOTE — Addendum Note (Signed)
Addended by: Doristine Devoid on: 06/16/2022 11:31 AM   Modules accepted: Orders

## 2022-06-16 NOTE — Telephone Encounter (Signed)
Patient's HCTZ  12.5 mg. rx[facility] [address]  On [facility]/12/22.    She wants this resent but wants to get #180 pills at a time because its much cheaper that way.   (She uses [facility])  [facility] [address]

## 2022-06-16 NOTE — Telephone Encounter (Signed)
Refilled

## 2022-07-03 ENCOUNTER — Other Ambulatory Visit: Payer: Self-pay | Admitting: Family Medicine

## 2022-07-03 DIAGNOSIS — E1159 Type 2 diabetes mellitus with other circulatory complications: Secondary | ICD-10-CM

## 2022-07-03 DIAGNOSIS — E785 Hyperlipidemia, unspecified: Secondary | ICD-10-CM

## 2022-07-06 ENCOUNTER — Ambulatory Visit (INDEPENDENT_AMBULATORY_CARE_PROVIDER_SITE_OTHER): Payer: Medicare Other | Admitting: Dermatology

## 2022-07-06 ENCOUNTER — Other Ambulatory Visit: Payer: Self-pay | Admitting: Family Medicine

## 2022-07-06 DIAGNOSIS — Z1283 Encounter for screening for malignant neoplasm of skin: Secondary | ICD-10-CM

## 2022-07-06 DIAGNOSIS — L578 Other skin changes due to chronic exposure to nonionizing radiation: Secondary | ICD-10-CM

## 2022-07-06 DIAGNOSIS — L814 Other melanin hyperpigmentation: Secondary | ICD-10-CM

## 2022-07-06 DIAGNOSIS — D1801 Hemangioma of skin and subcutaneous tissue: Secondary | ICD-10-CM

## 2022-07-06 DIAGNOSIS — L821 Other seborrheic keratosis: Secondary | ICD-10-CM

## 2022-07-06 DIAGNOSIS — I10 Essential (primary) hypertension: Secondary | ICD-10-CM

## 2022-07-06 DIAGNOSIS — D229 Melanocytic nevi, unspecified: Secondary | ICD-10-CM

## 2022-07-06 DIAGNOSIS — E039 Hypothyroidism, unspecified: Secondary | ICD-10-CM

## 2022-07-06 DIAGNOSIS — L719 Rosacea, unspecified: Secondary | ICD-10-CM | POA: Diagnosis not present

## 2022-07-06 DIAGNOSIS — L570 Actinic keratosis: Secondary | ICD-10-CM | POA: Diagnosis not present

## 2022-07-06 DIAGNOSIS — Z79899 Other long term (current) drug therapy: Secondary | ICD-10-CM | POA: Diagnosis not present

## 2022-07-06 DIAGNOSIS — L820 Inflamed seborrheic keratosis: Secondary | ICD-10-CM | POA: Diagnosis not present

## 2022-07-06 MED ORDER — DOXYCYCLINE MONOHYDRATE 100 MG PO CAPS: 100.0000 mg | ORAL_CAPSULE | Freq: Every day | ORAL | 11 refills | Status: DC

## 2022-07-06 MED ORDER — DOXYCYCLINE HYCLATE 20 MG PO TABS: 20.0000 mg | ORAL_TABLET | Freq: Every day | ORAL | 11 refills | Status: AC

## 2022-07-06 NOTE — Patient Instructions (Addendum)
Instructions for [facility] Medications  One or more of your medications was sent to the [facility] mail order compounding pharmacy. You will receive an email from them and can purchase the medicine through that link. It will then be mailed to your home at the address you confirmed. If for any reason you do not receive an email from them, please check your spam folder. If you still do not find the email, please let us know. [facility] phone number is [phone].    Doxycycline should be taken with food to prevent nausea. Do not lay down for 30 minutes after taking. Be cautious with sun exposure and use good sun protection while on this medication. Pregnant women should not take this medication.   Actinic keratoses are precancerous spots that appear secondary to cumulative UV radiation exposure/sun exposure over time. They are chronic with expected duration over 1 year. A portion of actinic keratoses will progress to squamous cell carcinoma of the skin. It is not possible to reliably predict which spots will progress to skin cancer and so treatment is recommended to prevent development of skin cancer.  Recommend daily broad spectrum sunscreen SPF 30+ to sun-exposed areas, reapply every 2 hours as needed.  Recommend staying in the shade or wearing Stiger sleeves, sun glasses (UVA+UVB protection) and wide brim hats (4-inch brim around the entire circumference of the hat). Call for new or changing lesions.     Cryotherapy Aftercare  Wash gently with soap and water everyday.   Apply Vaseline and Band-Aid daily until healed.    Seborrheic Keratosis  What causes seborrheic keratoses? Seborrheic keratoses are harmless, common skin growths that first appear during adult life.  As time goes by, more growths appear.  Some people may develop a large number of them.  Seborrheic keratoses appear on both covered and uncovered body parts.  They are not caused by sunlight.  The tendency to  develop seborrheic keratoses can be inherited.  They vary in color from skin-colored to gray, brown, or even black.  They can be either smooth or have a rough, warty surface.   Seborrheic keratoses are superficial and look as if they were stuck on the skin.  Under the microscope this type of keratosis looks like layers upon layers of skin.  That is why at times the top layer may seem to fall off, but the rest of the growth remains and re-grows.    Treatment Seborrheic keratoses do not need to be treated, but can easily be removed in the office.  Seborrheic keratoses often cause symptoms when they rub on clothing or jewelry.  Lesions can be in the way of shaving.  If they become inflamed, they can cause itching, soreness, or burning.  Removal of a seborrheic keratosis can be accomplished by freezing, burning, or surgery. If any spot bleeds, scabs, or grows rapidly, please return to have it checked, as these can be an indication of a skin cancer.     Melanoma ABCDEs  Melanoma is the most dangerous type of skin cancer, and is the leading cause of death from skin disease.  You are more likely to develop melanoma if you: Have light-colored skin, light-colored eyes, or red or blond hair Spend a lot of time in the sun Tan regularly, either outdoors or in a tanning bed Have had blistering sunburns, especially during childhood Have a close family member who has had a melanoma Have atypical moles or large birthmarks  Early detection of melanoma is key  since treatment is typically straightforward and cure rates are extremely high if we catch it early.   The first sign of melanoma is often a change in a mole or a new dark spot.  The ABCDE system is a way of remembering the signs of melanoma.  A for asymmetry:  The two halves do not match. B for border:  The edges of the growth are irregular. C for color:  A mixture of colors are present instead of an even brown color. D for diameter:  Melanomas are  usually (but not always) greater than 84m - the size of a pencil eraser. E for evolution:  The spot keeps changing in size, shape, and color.  Please check your skin once per month between visits. You can use a small mirror in front and a large mirror behind you to keep an eye on the back side or your body.   If you see any new or changing lesions before your next follow-up, please call to schedule a visit.  Please continue daily skin protection including broad spectrum sunscreen SPF 30+ to sun-exposed areas, reapplying every 2 hours as needed when you're outdoors.   Staying in the shade or wearing Ziff sleeves, sun glasses (UVA+UVB protection) and wide brim hats (4-inch brim around the entire circumference of the hat) are also recommended for sun protection.    Due to recent changes in healthcare laws, you may see results of your pathology and/or laboratory studies on MyChart before the doctors have had a chance to review them. We understand that in some cases there may be results that are confusing or concerning to you. Please understand that not all results are received at the same time and often the doctors may need to interpret multiple results in order to provide you with the best plan of care or course of treatment. Therefore, we ask that you please give u[place]2 business days to thoroughly review all your results before contacting the office for clarification. Should we see a critical lab result, you will be contacted sooner.   If You Need Anything After Your Visit  If you have any questions or concerns for your doctor, please call our main line at [phone] press option 4 to reach your doctor's medical assistant. If no one answers, please leave a voicemail as directed and we will return your call as soon as possible. Messages left after 4 pm will be answered the following business day.   You may also send u[place] message via [place] We typically respond to MyChart messages within 1-2  business days.  For prescription refills, please ask your pharmacy to contact our office. Our fax number is [phone]  If you have an urgent issue when the clinic is closed that cannot wait until the next business day, you can page your doctor at the number below.    Please note that while we do our best to be available for urgent issues outside of office hours, we are not available 24/7.   If you have an urgent issue and are unable to reach u[place] you may choose to seek medical care at your doctor's office, retail clinic, urgent care center, or emergency room.  If you have a medical emergency, please immediately call 911 or go to the emergency department.  Pager Numbers  - Dr. KNehemiah Massed [phone] - Dr. MLaurence Ferrari [phone] - Dr. SNicole Kindred [phone] In the event of inclement weather, please call our main line at [phone] an  update on the status of any delays or closures.  Dermatology Medication Tips: Please keep the boxes that topical medications come in in order to help keep track of the instructions about where and how to use these. Pharmacies typically print the medication instructions only on the boxes and not directly on the medication tubes.   If your medication is too expensive, please contact our office at [phone] option 4 or send [place] a message through [place].   We are unable to tell what your co-pay for medications will be in advance as this is different depending on your insurance coverage. However, we may be able to find a substitute medication at lower cost or fill out paperwork to get insurance to cover a needed medication.   If a prior authorization is required to get your medication covered by your insurance company, please allow [place] 1-2 business days to complete this process.  Drug prices often vary depending on where the prescription is filled and some pharmacies may offer cheaper prices.  The website www.goodrx.com contains coupons for medications  through different pharmacies. The prices here do not account for what the cost may be with help from insurance (it may be cheaper with your insurance), but the website can give you the price if you did not use any insurance.  - You can print the associated coupon and take it with your prescription to the pharmacy.  - You may also stop by our office during regular business hours and pick up a GoodRx coupon card.  - If you need your prescription sent electronically to a different pharmacy, notify our office through [facility] or by phone at [phone] option 4.     Si Usted Necesita Algo Despus de Su Visita  Tambin puede enviarnos un mensaje a travs de Pharmacist, community. Por lo general respondemos a los mensajes de MyChart en el transcurso de 1 a 2 das hbiles.  Para renovar recetas, por favor pida a su farmacia que se ponga en contacto con nuestra oficina. Harland Dingwall de fax es [place] [phone].  Si tiene un asunto urgente cuando la clnica est cerrada y que no puede esperar hasta el siguiente da hbil, puede llamar/localizar a su doctor(a) al nmero que aparece a continuacin.   Por favor, tenga en cuenta que aunque hacemos todo lo posible para estar disponibles para asuntos urgentes fuera del horario de [place], no estamos disponibles las 24 horas del da, los 7 das de la [place].   Si tiene un problema urgente y no puede comunicarse con nosotros, puede optar por buscar atencin mdica  en el consultorio de su doctor(a), en una clnica privada, en un centro de atencin urgente o en una sala de emergencias.  Si tiene Engineering geologist, por favor llame inmediatamente al 911 o vaya a la sala de emergencias.  Nmeros de bper  - Dr. Nehemiah Massed: [phone]  - Dra. Moye: [phone]  - Dra. Nicole Kindred: [phone]  En caso de inclemencias del [place], por favor llame a Johnsie Kindred principal al [phone] para una actualizacin sobre el [place] de cualquier retraso o  cierre.  Consejos para la medicacin en dermatologa: Por favor, guarde las cajas en las que vienen los medicamentos de uso tpico para ayudarle a seguir las instrucciones sobre dnde y cmo usarlos. Las farmacias generalmente imprimen las instrucciones del medicamento slo en las cajas y no directamente en los tubos del [place].   Si su medicamento es [employer], por favor, pngase en contacto con nuestra oficina llamando  al [phone] y presione la opcin 4 o envenos un mensaje a travs de Pharmacist, community.   No podemos decirle cul ser su copago por los medicamentos por adelantado ya que esto es diferente dependiendo de la cobertura de su seguro. Sin embargo, es posible que podamos encontrar un medicamento sustituto a Electrical engineer un formulario para que el seguro cubra el medicamento que se considera necesario.   Si se requiere una autorizacin previa para que su compaa de seguros [place] su medicamento, por favor permtanos de 1 a 2 das hbiles para completar este proceso.  Los precios de los medicamentos varan con frecuencia dependiendo del Environmental consultant de dnde se surte la receta y alguna farmacias pueden ofrecer precios ms baratos.  El sitio web www.goodrx.com tiene cupones para medicamentos de Airline pilot. Los precios aqu no tienen en cuenta lo que podra costar con la ayuda del seguro (puede ser ms barato con su seguro), pero el sitio web puede darle el precio si no utiliz Research scientist (physical sciences).  - Puede imprimir el cupn correspondiente y llevarlo con su receta a la farmacia.  - Tambin puede pasar por nuestra oficina durante el horario de atencin regular y Charity fundraiser una tarjeta de cupones de GoodRx.  - Si necesita que su receta se enve electrnicamente a una farmacia diferente, informe a nuestra oficina a travs de MyChart de  o por telfono llamando al [phone] y presione la opcin 4.

## 2022-07-06 NOTE — Progress Notes (Signed)
Follow-Up Visit   Subjective  Wendy Beck is a 76 y.o. female who presents for the following: Annual Exam (Tbse, hx of psoriasis , hx of erythem   , hx of bcc, spot at right side of face, hx of rosacea needs rfs of [facility]. Itchy spot at right upper arm ). The patient presents for Total-Body Skin Exam (TBSE) for skin cancer screening and mole check.  The patient has spots, moles and lesions to be evaluated, some may be new or changing and the patient has concerns that these could be cancer.  The following portions of the chart were reviewed this encounter and updated as appropriate:  Tobacco  Allergies  Meds  Problems  Med Hx  Surg Hx  Fam Hx     Review of Systems: No other skin or systemic complaints except as noted in HPI or Assessment and Plan.  Objective  Well appearing patient in no apparent distress; mood and affect are within normal limits.  A full examination was performed including scalp, head, eyes, ears, nose, lips, neck, chest, axillae, abdomen, back, buttocks, bilateral upper extremities, bilateral lower extremities, hands, feet, fingers, toes, fingernails, and toenails. All findings within normal limits unless otherwise noted below.  face Some active papules at cheeks with erythema   Right upper arm x 1, right elbow x 4, legs x 1 (6) Erythematous stuck-on, waxy papule or plaque  right infraorbital x 1, nose x 1 (2) Erythematous thin papules/macules with gritty scale.    Assessment & Plan  Rosacea face Rosacea is a chronic progressive skin condition usually affecting the face of adults, causing redness and/or acne bumps. It is treatable but not curable. It sometimes affects the eyes (ocular rosacea) as well. It may respond to topical and/or systemic medication and can flare with stress, sun exposure, alcohol, exercise, topical steroids (including hydrocortisone/cortisone 10) and some foods.  Daily application of broad spectrum spf 30+ sunscreen to face is  recommended to reduce flares. Chronic and persistent condition with duration or expected duration over one year. Condition is bothersome/symptomatic for patient. Currently flared. Secondary to stress of caring for her husband  Start doxycycline 20 mg tab take 1 by mouth daily with food.   Doxycycline should be taken with food to prevent nausea. Do not lay down for 30 minutes after taking. Be cautious with sun exposure and use good sun protection while on this medication. Pregnant women should not take this medication.   Continue [facility] metronidazole/ivermectin/azelaic acid twice daily as needed to affected areas on the face. The patient was advised this is not covered by insurance since it is made by a compounding pharmacy. They will receive an email to check out and the medication will be mailed to their home. Refills sent   doxycycline (PERIOSTAT) 20 MG tablet - face Take 1 tablet (20 mg total) by mouth daily. Take with evening meal. Take with food and drink.  Inflamed seborrheic keratosis (6) Right upper arm x 1, right elbow x 4, legs x 1 Symptomatic, irritating, patient would like treated. Destruction of lesion - Right upper arm x 1, right elbow x 4, legs x 1 Complexity: simple   Destruction method: cryotherapy   Informed consent: discussed and consent obtained   Timeout:  patient name, date of birth, surgical site, and procedure verified Lesion destroyed using liquid nitrogen: Yes   Region frozen until ice ball extended beyond lesion: Yes   Outcome: patient tolerated procedure well with no complications   Post-procedure  details: wound care instructions given   Additional details:  Prior to procedure, discussed risks of blister formation, small wound, skin dyspigmentation, or rare scar following cryotherapy. Recommend Vaseline ointment to treated areas while healing.  Actinic keratosis (2) right infraorbital x 1, nose x 1 Actinic keratoses are precancerous spots that appear  secondary to cumulative UV radiation exposure/sun exposure over time. They are chronic with expected duration over 1 year. A portion of actinic keratoses will progress to squamous cell carcinoma of the skin. It is not possible to reliably predict which spots will progress to skin cancer and so treatment is recommended to prevent development of skin cancer.  Recommend daily broad spectrum sunscreen SPF 30+ to sun-exposed areas, reapply every 2 hours as needed.  Recommend staying in the shade or wearing Kipnis sleeves, sun glasses (UVA+UVB protection) and wide brim hats (4-inch brim around the entire circumference of the hat). Call for new or changing lesions.  Destruction of lesion - right infraorbital x 1, nose x 1 Complexity: simple   Destruction method: cryotherapy   Informed consent: discussed and consent obtained   Timeout:  patient name, date of birth, surgical site, and procedure verified Lesion destroyed using liquid nitrogen: Yes   Region frozen until ice ball extended beyond lesion: Yes   Outcome: patient tolerated procedure well with no complications   Post-procedure details: wound care instructions given   Additional details:  Prior to procedure, discussed risks of blister formation, small wound, skin dyspigmentation, or rare scar following cryotherapy. Recommend Vaseline ointment to treated areas while healing.  Lentigines - Scattered tan macules - Due to sun exposure - Benign-appearing, observe - Recommend daily broad spectrum sunscreen SPF 30+ to sun-exposed areas, reapply every 2 hours as needed. - Call for any changes  Seborrheic Keratoses - Stuck-on, waxy, tan-brown papules and/or plaques  - Benign-appearing - Discussed benign etiology and prognosis. - Observe - Call for any changes  Melanocytic Nevi - Tan-brown and/or pink-flesh-colored symmetric macules and papules - Benign appearing on exam today - Observation - Call clinic for new or changing moles - Recommend  daily use of broad spectrum spf 30+ sunscreen to sun-exposed areas.   Hemangiomas - Red papules - Discussed benign nature - Observe - Call for any changes  Actinic Damage - Chronic condition, secondary to cumulative UV/sun exposure - diffuse scaly erythematous macules with underlying dyspigmentation - Recommend daily broad spectrum sunscreen SPF 30+ to sun-exposed areas, reapply every 2 hours as needed.  - Staying in the shade or wearing Murrell sleeves, sun glasses (UVA+UVB protection) and wide brim hats (4-inch brim around the entire circumference of the hat) are also recommended for sun protection.  - Call for new or changing lesions.  Skin cancer screening performed today. Return in about 6 months (around 01/05/2023) for 6 month ak and rosacea follow up, 1 year tbse . IRuthell Rummage, CMA, am acting as scribe for Sarina Ser, MD. Documentation: I have reviewed the above documentation for accuracy and completeness, and I agree with the above.  Sarina Ser, MD

## 2022-07-08 ENCOUNTER — Other Ambulatory Visit: Payer: Self-pay | Admitting: Family Medicine

## 2022-07-08 DIAGNOSIS — E039 Hypothyroidism, unspecified: Secondary | ICD-10-CM

## 2022-07-10 NOTE — Telephone Encounter (Signed)
Called pt and LM on VM to call back. Advised pt that Dr. Rosanna Randy is no longer at our practice, so will need to transfer her care to another provider in the office or she can follow Dr. Rosanna Randy.  RF is not due  Last RF 12/28/21 #90 3 RF   Requested Prescriptions  Pending Prescriptions Disp Refills   levothyroxine (SYNTHROID) 100 MCG tablet [Pharmacy Med Name: Levothyroxine Sodium 100 MCG Oral Tablet] 90 tablet 0    Sig: TAKE 1 TABLET BY MOUTH ONCE DAILY BEFORE  BREAKFAST     Endocrinology:  Hypothyroid Agents Passed - 07/08/2022  2:38 PM      Passed - TSH in normal range and within 360 days    TSH  Date Value Ref Range Status  04/26/2022 1.330 0.450 - 4.500 uIU/mL Final         Passed - Valid encounter within last 12 months    Recent Outpatient Visits           2 months ago Encounter for [employer] annual wellness exam   [facility] Jerrol Banana., MD   6 months ago Type 2 diabetes mellitus with other circulatory complication, 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 [facility])   [facility] Jerrol Banana[facility] MD   9 months ago Type 2 diabetes mellitus with other circulatory complication, without Pulice-term current use of insulin [facility])   [place], Jake Church, DO   1 year ago Encounter for [employer] annual wellness exam   [facility] Jerrol Banana., MD   1 year ago Type 2 diabetes mellitus with other circulatory complication, without Sabic-term current use of insulin [facility])   [facility] Jerrol Banana., MD       Future Appointments             In 3 months Jerrol Banana., MD [facility], [place]   In 6 months Ralene Bathe, MD [place]

## 2022-07-11 ENCOUNTER — Encounter: Payer: Self-pay | Admitting: Dermatology

## 2022-08-07 ENCOUNTER — Ambulatory Visit (INDEPENDENT_AMBULATORY_CARE_PROVIDER_SITE_OTHER): Payer: Medicare Other | Admitting: Physician Assistant

## 2022-08-07 ENCOUNTER — Encounter: Payer: Self-pay | Admitting: Physician Assistant

## 2022-08-07 ENCOUNTER — Ambulatory Visit: Payer: Self-pay

## 2022-08-07 VITALS — BP 109/44 | HR 69 | Ht 63.0 in | Wt 178.3 lb

## 2022-08-07 DIAGNOSIS — J209 Acute bronchitis, unspecified: Secondary | ICD-10-CM

## 2022-08-07 DIAGNOSIS — I959 Hypotension, unspecified: Secondary | ICD-10-CM | POA: Diagnosis not present

## 2022-08-07 LAB — POC COVID19 BINAXNOW: SARS Coronavirus 2 Ag: NEGATIVE

## 2022-08-07 MED ORDER — ALBUTEROL SULFATE HFA 108 (90 BASE) MCG/ACT IN AERS: 2.0000 | INHALATION_SPRAY | Freq: Four times a day (QID) | RESPIRATORY_TRACT | 2 refills | Status: AC | PRN

## 2022-08-07 MED ORDER — PREDNISONE 10 MG PO TABS: ORAL_TABLET | ORAL | 0 refills | Status: AC

## 2022-08-07 MED ORDER — BENZONATATE 100 MG PO CAPS: 100.0000 mg | ORAL_CAPSULE | Freq: Two times a day (BID) | ORAL | 0 refills | Status: AC | PRN

## 2022-08-07 NOTE — Progress Notes (Signed)
I,Sha'taria Tyson,acting as a Education administrator for [employer], PA-C.,have documented all relevant documentation on the behalf of Wendy Kirschner, PA-C,as directed by  Wendy Kirschner, PA-C while in the presence of Wendy Kirschner, PA-C.   Established patient visit   Patient: Wendy Beck   DOB: January 17, 1946   76 y.o. Female  MRN: [id number] Visit Date: 08/07/2022  Today's healthcare provider: [name], PA-C   Cc.   Subjective      Pt reports cough, SOB x 5-6 days. Improved w/ OTC alka seltzer cold. Reports worsening symptoms while lying down. Denies chest pain, fevers/chills. Denies home covid test. Denies smoking history.  Outpatient Medications Prior to Visit  Medication Sig   atorvastatin (LIPITOR) 40 MG tablet TAKE ONE TABLET BY MOUTH DAILY   b complex vitamins tablet Take 1 tablet by mouth daily with lunch.   bisoprolol-hydrochlorothiazide (ZIAC) 5-6.25 MG tablet Take 1 tablet by mouth daily.   blood glucose meter kit and supplies Dispense based on patient and insurance preference. Use Once daily as directed. (FOR ICD-10 E10.59).   cholecalciferol (VITAMIN D) 1000 units tablet Take 2,000 Units by mouth daily with lunch.    Coenzyme Q10 300 MG WAFR Take 300 mg by mouth daily with lunch.   Cyanocobalamin (VITAMIN B-12) 5000 MCG SUBL Place under the tongue daily.   diclofenac Sodium (VOLTAREN) 1 % GEL Apply 4 g topically 4 (four) times daily. As needed   doxycycline (PERIOSTAT) 20 MG tablet Take 1 tablet (20 mg total) by mouth daily. Take with evening meal. Take with food and drink.   fexofenadine-pseudoephedrine (ALLEGRA-D) 60-120 MG 12 hr tablet Take 1 tablet by mouth 2 (two) times daily.   Glucosamine Sulfate (DONA PO) Take 2 tablets by mouth daily with lunch.    hydrochlorothiazide (HYDRODIURIL) 12.5 MG tablet Take 1 tablet (12.5 mg total) by mouth daily.   levothyroxine (SYNTHROID) 100 MCG tablet TAKE 1 TABLET BY MOUTH ONCE DAILY BEFORE  BREAKFAST   losartan (COZAAR) 100 MG  tablet Take 1 tablet (100 mg total) by mouth daily.   metFORMIN (GLUCOPHAGE-XR) 500 MG 24 hr tablet TAKE TWO TABLETS BY MOUTH DAILY   modafinil (PROVIGIL) 200 MG tablet Take 1 tablet (200 mg total) by mouth daily.   Multiple Vitamins-Minerals (HAIR VITAMINS PO) Take 2 tablets by mouth daily with lunch.    Omega-3 Fatty Acids (FISH OIL ULTRA) 1400 MG CAPS Take 4,200 mg by mouth daily at 2 PM.   potassium chloride (KLOR-CON) 10 MEQ tablet Take 10 mEq by mouth daily. As needed   No facility-administered medications prior to visit.    Review of Systems  Constitutional:  Negative for appetite change, chills, fatigue and fever.  Respiratory:  Positive for cough and shortness of breath. Negative for chest tightness.   Cardiovascular:  Negative for chest pain and palpitations.  Gastrointestinal:  Negative for abdominal pain, nausea and vomiting.  Neurological:  Negative for dizziness and weakness.     Objective    Blood pressure (!) 109/44, pulse 69, height _0  (1.6 m), weight 178 lb 4.8 oz (80.9 kg), SpO2 96 %.   Physical Exam Constitutional:      General: She is awake.     Appearance: She is well-developed.  HENT:     Head: Normocephalic.  Eyes:     Conjunctiva/sclera: Conjunctivae normal.  Cardiovascular:     Rate and Rhythm: Normal rate and regular rhythm.     Heart sounds: Normal heart sounds.  Pulmonary:  Effort: Pulmonary effort is normal. No respiratory distress.     Breath sounds: No stridor. Wheezing present. No rhonchi or rales.  Skin:    General: Skin is warm.  Neurological:     Mental Status: She is alert and oriented to person, place, and time.  Psychiatric:        Attention and Perception: Attention normal.        Mood and Affect: Mood normal.        Speech: Speech normal.        Behavior: Behavior is cooperative.      No results found for any visits on 08/07/22.  Assessment & Plan     Acute bronchitis POC covid negative Rx prednisone taper, advised  will inc sugars to monitor Rx albuterol inhaler prn q 4-6 hrs Rx tessalon pearles for cough Advised if no improvement in the next 3-4 days to call office.  2. Hypotension, asymptomatic Increase fluids, skip HCTZ 12.5 mg for the next 3-4 days. Monitor pressure at home  Return if symptoms worsen or fail to improve.      I, Wendy Kirschner, PA-C have reviewed all documentation for this visit. The documentation on  08/07/2022 for the exam, diagnosis, procedures, and orders are all accurate and complete.  Wendy Kirschner, PA-C [facility] [address] [place], [place], [address] Office: [phone] Fax: [place]

## 2022-08-07 NOTE — Telephone Encounter (Signed)
  Chief Complaint: near constant cough Symptoms: dry cough Frequency: 1 week Pertinent Negatives: Patient denies chest pain Disposition: '[]'$ ED /'[]'$ Urgent Care (no appt availability in office) / '[x]'$ Appointment(In office/virtual)/ '[]'$  [facility]/ '[]'$ Home Care/ '[]'$ Refused Recommended Disposition /'[]'$ [facility]/ '[]'$  Follow-up with PCP Additional Notes: Pt has had a cough for 1 week. Pt cannot lay down. Cough is non productive , but sounds wet.    Reason for Disposition  SEVERE coughing spells (e.g., whooping sound after coughing, vomiting after coughing)  Answer Assessment - Initial Assessment Questions 1. ONSET: "When did the cough begin?"      1 week ago 2. SEVERITY: "How bad is the cough today?"      Did not sleep last night 3. SPUTUM: "Describe the color of your sputum" (none, dry cough; clear, white, yellow, green)     thick 4. HEMOPTYSIS: "Are you coughing up any blood?" If so ask: "How much?" (flecks, streaks, tablespoons, etc.)     no 5. DIFFICULTY BREATHING: "Are you having difficulty breathing?" If Yes, ask: "How bad is it?" (e.g., mild, moderate, severe)    - MILD: No SOB at rest, mild SOB with walking, speaks normally in sentences, can lie down, no retractions, pulse < 100.    - MODERATE: SOB at rest, SOB with minimal exertion and prefers to sit, cannot lie down flat, speaks in phrases, mild retractions, audible wheezing, pulse 100-120.    - SEVERE: Very SOB at rest, speaks in single words, struggling to breathe, sitting hunched forward, retractions, pulse > 120      mild 6. FEVER: "Do you have a fever?" If Yes, ask: "What is your temperature, how was it measured, and when did it start?"     no 7. CARDIAC HISTORY: "Do you have any history of heart disease?" (e.g., heart attack, congestive heart failure)      no 8. LUNG HISTORY: "Do you have any history of lung disease?"  (e.g., pulmonary embolus, asthma, emphysema)     Yes - scar tissue in lungs 9. PE RISK  FACTORS: "Do you have a history of blood clots?" (or: recent major surgery, recent prolonged travel, bedridden)      10. OTHER SYMPTOMS: "Do you have any other symptoms?" (e.g., runny nose, wheezing, chest pain)       11. PREGNANCY: "Is there any chance you are pregnant?" "When was your last menstrual period?"        12. TRAVEL: "Have you traveled out of the country in the last month?" (e.g., travel history, exposures)  Protocols used: Cough - Acute Non-Productive-A-AH

## 2022-08-11 ENCOUNTER — Ambulatory Visit: Payer: Self-pay

## 2022-08-11 NOTE — Telephone Encounter (Addendum)
Message from [place] sent at 08/11/2022 12:02 PM EST  Summary: has green phlegm that has bad test,makes her feel nauseated.   She already had an appt on Nov 6  ----- Message from Bayard Beaver sent at 08/11/2022 11:56 AM EST ----- Stopped up  ----- Message from Bayard Beaver sent at 08/11/2022 11:55 AM EST ----- Patient called in, says, sweating, has green phlegm that has bad test,makes her feel nauseated.  She is asking for an antibiotic. [place], [place] Phone: [phone] Fax: [phone]        Third attempt to reach pt. Left message on VM to call back. Reason for Disposition  Third attempt to contact caller AND no contact made. Phone number verified.  Protocols used: No Contact or Duplicate Contact Call-A-AH

## 2022-08-11 NOTE — Telephone Encounter (Signed)
Summary: has green phlegm that has bad test,makes her feel nauseated.   She already had an appt on Nov 6  ----- Message from Bayard Beaver sent at 08/11/2022 11:56 AM EST ----- Stopped up  ----- Message from Bayard Beaver sent at 08/11/2022 11:55 AM EST ----- Patient called in, says, sweating, has green phlegm that has bad test,makes her feel nauseated.  She is asking for an antibiotic. [place], [place] Phone: [phone] Fax: [phone]      Called pt  - [facility]

## 2022-08-11 NOTE — Telephone Encounter (Signed)
Message from [place] sent at 08/11/2022 12:02 PM EST  Summary: has green phlegm that has bad test,makes her feel nauseated.   She already had an appt on Nov 6  ----- Message from Bayard Beaver sent at 08/11/2022 11:56 AM EST ----- Stopped up  ----- Message from Bayard Beaver sent at 08/11/2022 11:55 AM EST ----- Patient called in, says, sweating, has green phlegm that has bad test,makes her feel nauseated.  She is asking for an antibiotic. [place], [place] Phone: [phone] Fax: [phone]        Called pt and LM on VM to call back.

## 2022-08-14 ENCOUNTER — Telehealth: Payer: Self-pay | Admitting: Family Medicine

## 2022-08-14 DIAGNOSIS — G47419 Narcolepsy without cataplexy: Secondary | ICD-10-CM

## 2022-08-14 NOTE — Telephone Encounter (Signed)
[facility] pharmacy faxed refill request for the following medications:     modafinil (PROVIGIL) 200 MG tablet   Please advise

## 2022-08-15 MED ORDER — MODAFINIL 200 MG PO TABS: 200.0000 mg | ORAL_TABLET | Freq: Every day | ORAL | 1 refills | Status: DC

## 2022-08-29 ENCOUNTER — Other Ambulatory Visit: Payer: Self-pay | Admitting: Physician Assistant

## 2022-08-29 MED ORDER — AZITHROMYCIN 250 MG PO TABS: ORAL_TABLET | ORAL | 0 refills | Status: AC

## 2022-08-29 NOTE — Telephone Encounter (Signed)
Patient calling back that she still has the same symptoms that she had when she came in to see Ria Comment. Patient is asking if an antibiotic can be send in. Pharmacy: [facility] in [place] [employer] [facility] [place]

## 2022-08-29 NOTE — Telephone Encounter (Signed)
Patient advised.

## 2022-09-07 ENCOUNTER — Ambulatory Visit (INDEPENDENT_AMBULATORY_CARE_PROVIDER_SITE_OTHER): Payer: Medicare Other | Admitting: Dermatology

## 2022-09-07 DIAGNOSIS — C44622 Squamous cell carcinoma of skin of right upper limb, including shoulder: Secondary | ICD-10-CM

## 2022-09-07 DIAGNOSIS — D492 Neoplasm of unspecified behavior of bone, soft tissue, and skin: Secondary | ICD-10-CM

## 2022-09-07 DIAGNOSIS — C4492 Squamous cell carcinoma of skin, unspecified: Secondary | ICD-10-CM

## 2022-09-07 HISTORY — DX: Squamous cell carcinoma of skin, unspecified: C44.92

## 2022-09-07 NOTE — Progress Notes (Signed)
   Follow-Up Visit   Subjective  Wendy Beck is a 76 y.o. female who presents for the following: Follow-up (Recheck her right upper arm, patient had a irritated spot treated with LN2  2 months ago now this spot has come back).  The following portions of the chart were reviewed this encounter and updated as appropriate:   Tobacco  Allergies  Meds  Problems  Med Hx  Surg Hx  Fam Hx     Review of Systems:  No other skin or systemic complaints except as noted in HPI or Assessment and Plan.  Objective  Well appearing patient in no apparent distress; mood and affect are within normal limits.  A focused examination was performed including right arm. Relevant physical exam findings are noted in the Assessment and Plan.  right tricep near the elbow 0.6 cm keratotic papule        Assessment & Plan  Neoplasm of skin right tricep near the elbow  Epidermal / dermal shaving  Lesion diameter (cm):  0.6 Informed consent: discussed and consent obtained   Timeout: patient name, date of birth, surgical site, and procedure verified   Procedure prep:  Patient was prepped and draped in usual sterile fashion Prep type:  Isopropyl alcohol Anesthesia: the lesion was anesthetized in a standard fashion   Anesthetic:  1% lidocaine w/ epinephrine 1-100,000 buffered w/ 8.4% NaHCO3 Hemostasis achieved with: pressure, aluminum chloride and electrodesiccation   Outcome: patient tolerated procedure well   Post-procedure details: sterile dressing applied and wound care instructions given   Dressing type: bandage and petrolatum    Destruction of lesion  Destruction method: electrodesiccation and curettage   Informed consent: discussed and consent obtained   Timeout:  patient name, date of birth, surgical site, and procedure verified Anesthesia: the lesion was anesthetized in a standard fashion   Anesthetic:  1% lidocaine w/ epinephrine 1-100,000 buffered w/ 8.4% NaHCO3 Curettage performed in  three different directions: Yes   Electrodesiccation performed over the curetted area: Yes   Curettage cycles:  3 Lesion length (cm):  0.6 Lesion width (cm):  0.6 Margin per side (cm):  0.2 Final wound size (cm):  1 Hemostasis achieved with:  electrodesiccation Outcome: patient tolerated procedure well with no complications   Post-procedure details: sterile dressing applied and wound care instructions given   Dressing type: petrolatum    Specimen 1 - Surgical pathology Differential Diagnosis: R/O ISK vs SCC  Check Margins: No   Return for scheduled appt  April 2024.  IMarye Round, CMA, am acting as scribe for Sarina Ser, MD .  Documentation: I have reviewed the above documentation for accuracy and completeness, and I agree with the above.  Sarina Ser, MD

## 2022-09-07 NOTE — Patient Instructions (Addendum)
Wound Care Instructions  Cleanse wound gently with soap and water once a day then pat dry with clean gauze. Apply a thin coat of Petrolatum (petroleum jelly, "Vaseline") over the wound (unless you have an allergy to this). We recommend that you use a new, sterile tube of Vaseline. Do not pick or remove scabs. Do not remove the yellow or white "healing tissue" from the base of the wound.  Cover the wound with fresh, clean, nonstick gauze and secure with paper tape. You may use Band-Aids in place of gauze and tape if the wound is small enough, but would recommend trimming much of the tape off as there is often too much. Sometimes Band-Aids can irritate the skin.  You should call the office for your biopsy report after 1 week if you have not already been contacted.  If you experience any problems, such as abnormal amounts of bleeding, swelling, significant bruising, significant pain, or evidence of infection, please call the office immediately.  FOR ADULT SURGERY PATIENTS: If you need something for pain relief you may take 1 extra strength Tylenol (acetaminophen) AND 2 Ibuprofen (200mg each) together every 4 hours as needed for pain. (do not take these if you are allergic to them or if you have a reason you should not take them.) Typically, you may only need pain medication for 1 to 3 days.     Due to recent changes in healthcare laws, you may see results of your pathology and/or laboratory studies on MyChart before the doctors have had a chance to review them. We understand that in some cases there may be results that are confusing or concerning to you. Please understand that not all results are received at the same time and often the doctors may need to interpret multiple results in order to provide you with the best plan of care or course of treatment. Therefore, we ask that you please give us 2 business days to thoroughly review all your results before contacting the office for clarification. Should  we see a critical lab result, you will be contacted sooner.   If You Need Anything After Your Visit  If you have any questions or concerns for your doctor, please call our main line at [phone] and press option 4 to reach your doctor's medical assistant. If no one answers, please leave a voicemail as directed and we will return your call as soon as possible. Messages left after 4 pm will be answered the following business day.   You may also send us a message via MyChart. We typically respond to MyChart messages within 1-2 business days.  For prescription refills, please ask your pharmacy to contact our office. Our fax number is [phone].  If you have an urgent issue when the clinic is closed that cannot wait until the next business day, you can page your doctor at the number below.    Please note that while we do our best to be available for urgent issues outside of office hours, we are not available 24/7.   If you have an urgent issue and are unable to reach us, you may choose to seek medical care at your doctor's office, retail clinic, urgent care center, or emergency room.  If you have a medical emergency, please immediately call 911 or go to the emergency department.  Pager Numbers  - Dr. Kowalski: [phone]  - Dr. Moye: [phone]  - Dr. Stewart: [phone]  In the event of inclement weather, please call our main line at   [phone] for an update on the status of any delays or closures.  Dermatology Medication Tips: Please keep the boxes that topical medications come in in order to help keep track of the instructions about where and how to use these. Pharmacies typically print the medication instructions only on the boxes and not directly on the medication tubes.   If your medication is too expensive, please contact our office at [phone] option 4 or send us a message through MyChart.   We are unable to tell what your co-pay for medications will be in  advance as this is different depending on your insurance coverage. However, we may be able to find a substitute medication at lower cost or fill out paperwork to get insurance to cover a needed medication.   If a prior authorization is required to get your medication covered by your insurance company, please allow us 1-2 business days to complete this process.  Drug prices often vary depending on where the prescription is filled and some pharmacies may offer cheaper prices.  The website www.goodrx.com contains coupons for medications through different pharmacies. The prices here do not account for what the cost may be with help from insurance (it may be cheaper with your insurance), but the website can give you the price if you did not use any insurance.  - You can print the associated coupon and take it with your prescription to the pharmacy.  - You may also stop by our office during regular business hours and pick up a GoodRx coupon card.  - If you need your prescription sent electronically to a different pharmacy, notify our office through [facility] MyChart or by phone at [phone] option 4.     Si Usted Necesita Algo Despus de Su Visita  Tambin puede enviarnos un mensaje a travs de MyChart. Por lo general respondemos a los mensajes de MyChart en el transcurso de 1 a 2 das hbiles.  Para renovar recetas, por favor pida a su farmacia que se ponga en contacto con nuestra oficina. Nuestro nmero de fax es el [phone].  Si tiene un asunto urgente cuando la clnica est cerrada y que no puede esperar hasta el siguiente da hbil, puede llamar/localizar a su doctor(a) al nmero que aparece a continuacin.   Por favor, tenga en cuenta que aunque hacemos todo lo posible para estar disponibles para asuntos urgentes fuera del horario de oficina, no estamos disponibles las 24 horas del da, los 7 das de la semana.   Si tiene un problema urgente y no puede comunicarse con nosotros, puede  optar por buscar atencin mdica  en el consultorio de su doctor(a), en una clnica privada, en un centro de atencin urgente o en una sala de emergencias.  Si tiene una emergencia mdica, por favor llame inmediatamente al 911 o vaya a la sala de emergencias.  Nmeros de bper  - Dr. Kowalski: [phone]  - Dra. Moye: [phone]  - Dra. Stewart: [phone]  En caso de inclemencias del tiempo, por favor llame a nuestra lnea principal al [phone] para una actualizacin sobre el estado de cualquier retraso o cierre.  Consejos para la medicacin en dermatologa: Por favor, guarde las cajas en las que vienen los medicamentos de uso tpico para ayudarle a seguir las instrucciones sobre dnde y cmo usarlos. Las farmacias generalmente imprimen las instrucciones del medicamento slo en las cajas y no directamente en los tubos del medicamento.   Si su medicamento es muy caro, por favor, pngase en contacto con   nuestra oficina llamando al [phone] y presione la opcin 4 o envenos un mensaje a travs de MyChart.   No podemos decirle cul ser su copago por los medicamentos por adelantado ya que esto es diferente dependiendo de la cobertura de su seguro. Sin embargo, es posible que podamos encontrar un medicamento sustituto a menor costo o llenar un formulario para que el seguro cubra el medicamento que se considera necesario.   Si se requiere una autorizacin previa para que su compaa de seguros cubra su medicamento, por favor permtanos de 1 a 2 das hbiles para completar este proceso.  Los precios de los medicamentos varan con frecuencia dependiendo del lugar de dnde se surte la receta y alguna farmacias pueden ofrecer precios ms baratos.  El sitio web www.goodrx.com tiene cupones para medicamentos de diferentes farmacias. Los precios aqu no tienen en cuenta lo que podra costar con la ayuda del seguro (puede ser ms barato con su seguro), pero el sitio web puede darle el  precio si no utiliz ningn seguro.  - Puede imprimir el cupn correspondiente y llevarlo con su receta a la farmacia.  - Tambin puede pasar por nuestra oficina durante el horario de atencin regular y recoger una tarjeta de cupones de GoodRx.  - Si necesita que su receta se enve electrnicamente a una farmacia diferente, informe a nuestra oficina a travs de MyChart de [facility] o por telfono llamando al [phone] y presione la opcin 4.  

## 2022-09-17 ENCOUNTER — Encounter: Payer: Self-pay | Admitting: Dermatology

## 2022-09-18 ENCOUNTER — Telehealth: Payer: Self-pay

## 2022-09-18 NOTE — Telephone Encounter (Signed)
-----   Message from Ralene Bathe, MD sent at 09/16/2022  1:59 PM EST ----- Diagnosis Skin , right tricep near the elbow [place] - SCC Already treated Recheck next visit

## 2022-09-18 NOTE — Telephone Encounter (Signed)
Advised patient of results/hd  

## 2022-10-26 ENCOUNTER — Encounter: Payer: Self-pay | Admitting: Family Medicine

## 2022-10-26 ENCOUNTER — Ambulatory Visit (INDEPENDENT_AMBULATORY_CARE_PROVIDER_SITE_OTHER): Payer: Medicare Other | Admitting: Family Medicine

## 2022-10-26 VITALS — BP 149/61 | HR 65 | Temp 98.0°F | Resp 16 | Ht 62.0 in | Wt 177.3 lb

## 2022-10-26 DIAGNOSIS — E039 Hypothyroidism, unspecified: Secondary | ICD-10-CM | POA: Diagnosis not present

## 2022-10-26 DIAGNOSIS — E1159 Type 2 diabetes mellitus with other circulatory complications: Secondary | ICD-10-CM | POA: Diagnosis not present

## 2022-10-26 DIAGNOSIS — E785 Hyperlipidemia, unspecified: Secondary | ICD-10-CM | POA: Diagnosis not present

## 2022-10-26 DIAGNOSIS — I10 Essential (primary) hypertension: Secondary | ICD-10-CM

## 2022-10-26 DIAGNOSIS — E119 Type 2 diabetes mellitus without complications: Secondary | ICD-10-CM | POA: Insufficient documentation

## 2022-10-26 NOTE — Assessment & Plan Note (Signed)
Elevated systolic BP  2 elevated measurements today  Recommended patient follow up in 3 months for repeat check  She will continue current medications including bisoprolol-hydrochlorothiazide 6 5-6.25 mg daily, losartan 100 mg daily, hydrochlorothiazide 12.5 mg daily CMP ordered today

## 2022-10-26 NOTE — Progress Notes (Signed)
I,Wendy Beck,acting as a scribe for [employer], MD.,have documented all relevant documentation on the behalf of Wendy Foster, MD,as directed by  Wendy Foster, MD while in the presence of Wendy Foster, MD.   Established patient visit   Patient: Wendy Beck   DOB: 09-12-1946   77 y.o. Female  MRN: [id number] Visit Date: 10/26/2022  Today's healthcare provider: [name], MD   Chief Complaint  Patient presents with   Chronic Disease follow-up   Subjective    HPI  Diabetes Mellitus Type II, follow-up  Lab Results  Component Value Date   HGBA1C 6.8 (H) 04/26/2022   HGBA1C 6.3 (A) 12/28/2021   HGBA1C 6.6 (A) 09/14/2021   Last seen for diabetes 6 months ago.  Management since then includes continuing the same treatment including metformin . She reports excellent compliance with treatment.  Home blood sugar records:  not being checked at home  Episodes of hypoglycemia? No  Current insulin regiment: NO Most Recent Eye Exam:  going tomorrow, [facility]  --------------------------------------------------------------------------------------------------- Hypertension, follow-up  BP Readings from Last 3 Encounters:  10/26/22 (!) 149/61  08/07/22 (!) 109/44  04/26/22 (!) 127/58   Wt Readings from Last 3 Encounters:  10/26/22 177 lb 4.8 oz (80.4 kg)  08/07/22 178 lb 4.8 oz (80.9 kg)  04/26/22 179 lb (81.2 kg)     She was last seen for hypertension 6 months ago.  BP at that visit was 12/587. Management since that visit includes none. She reports excellent compliance with treatment.  Outside blood pressures are not being checked   Symptoms: No appetite changes No foot ulcerations  No chest pain No chest pressure/discomfort  No dyspnea No orthopnea  Yes fatigue No lower extremity edema  No palpitations No paroxysmal nocturnal dyspnea  Yes nausea No numbness or tingling of extremity  No  polydipsia No polyuria  No speech difficulty No syncope   Hx of Migraines  Reports that she used to take a solution  for migraines was found to have problems  with liver function  She reports that it did cure her migraines at the time but then changed to injections and has fortunately outgrown the migraines  Emprim compound  States she has chronic pain   Lipid/Cholesterol, Follow-up  Last lipid panel Other pertinent labs  Lab Results  Component Value Date   CHOL 162 04/26/2022   HDL 42 04/26/2022   LDLCALC 99 04/26/2022   TRIG 116 04/26/2022   CHOLHDL 3.6 12/28/2021   Lab Results  Component Value Date   ALT 44 (H) 04/26/2022   AST 29 04/26/2022   PLT 178 04/26/2022   TSH 1.330 04/26/2022      She has typed a letter for today's visit highlighting her need to have liver enzymes and cholesterol levels checked every 3 months  She states that taking statins caused abnormal liver enzyme levels  She state that she takes atorvastatin 5 days per week and skips Wed and Sunday  She is also working on her diet  States she has never had hepatitis and has had prior US of her live   She reports good compliance with treatment. She is not having side effects.      The 10-year ASCVD risk score (Arnett DK, et al., 2019) is: 57.1%  ---------------------------------------------------------------------------------------------------    Medications: Outpatient Medications Prior to Visit  Medication Sig   atorvastatin (LIPITOR) 40 MG tablet TAKE ONE TABLET BY MOUTH DAILY   b complex vitamins  tablet Take 1 tablet by mouth daily with lunch.   bisoprolol-hydrochlorothiazide (ZIAC) 5-6.25 MG tablet Take 1 tablet by mouth daily.   blood glucose meter kit and supplies Dispense based on patient and insurance preference. Use Once daily as directed. (FOR ICD-10 E10.59).   cholecalciferol (VITAMIN D) 1000 units tablet Take 2,000 Units by mouth daily with lunch.    Coenzyme Q10 300 MG WAFR Take  300 mg by mouth daily with lunch.   Cyanocobalamin (VITAMIN B-12) 5000 MCG SUBL Place under the tongue daily.   doxycycline (PERIOSTAT) 20 MG tablet Take 1 tablet (20 mg total) by mouth daily. Take with evening meal. Take with food and drink.   fexofenadine-pseudoephedrine (ALLEGRA-D) 60-120 MG 12 hr tablet Take 1 tablet by mouth 2 (two) times daily.   Glucosamine Sulfate (DONA PO) Take 2 tablets by mouth daily with lunch.    hydrochlorothiazide (HYDRODIURIL) 12.5 MG tablet Take 1 tablet (12.5 mg total) by mouth daily.   levothyroxine (SYNTHROID) 100 MCG tablet TAKE 1 TABLET BY MOUTH ONCE DAILY BEFORE  BREAKFAST   losartan (COZAAR) 100 MG tablet Take 1 tablet (100 mg total) by mouth daily.   metFORMIN (GLUCOPHAGE-XR) 500 MG 24 hr tablet TAKE TWO TABLETS BY MOUTH DAILY   Methylsulfonylmethane 1000 MG TABS Take by oral route.   modafinil (PROVIGIL) 200 MG tablet Take 1 tablet (200 mg total) by mouth daily.   Multiple Vitamins-Minerals (HAIR VITAMINS PO) Take 2 tablets by mouth daily with lunch.    Omega-3 Fatty Acids (FISH OIL ULTRA) 1400 MG CAPS Take 4,200 mg by mouth daily at 2 PM.   potassium chloride (KLOR-CON) 10 MEQ tablet Take 10 mEq by mouth daily. As needed   albuterol (VENTOLIN HFA) 108 (90 Base) MCG/ACT inhaler Inhale 2 puffs into the lungs every 6 (six) hours as needed for wheezing or shortness of breath. (Patient not taking: Reported on 10/26/2022)   benzonatate (TESSALON) 100 MG capsule Take 1 capsule (100 mg total) by mouth 2 (two) times daily as needed for cough.   diclofenac Sodium (VOLTAREN) 1 % GEL Apply 4 g topically 4 (four) times daily. As needed   No facility-administered medications prior to visit.    Review of Systems     Objective    BP (!) 149/61 (BP Location: Left Arm, Patient Position: Sitting, Cuff Size: Normal)   Pulse 65   Temp 98 F (36.7 C) (Oral)   Resp 16   Ht '5\' 2"'$  (1.575 m)   Wt 177 lb 4.8 oz (80.4 kg)   SpO2 99%   BMI 32.43 kg/m    Physical  Exam Vitals reviewed.  Constitutional:      General: She is not in acute distress.    Appearance: Normal appearance. She is not ill-appearing, toxic-appearing or diaphoretic.  Eyes:     Conjunctiva/sclera: Conjunctivae normal.  Cardiovascular:     Rate and Rhythm: Normal rate and regular rhythm.     Pulses: Normal pulses.     Heart sounds: Murmur heard.     No friction rub. No gallop.  Pulmonary:     Effort: Pulmonary effort is normal. No respiratory distress.     Breath sounds: Normal breath sounds. No stridor. No wheezing, rhonchi or rales.  Abdominal:     General: Bowel sounds are normal. There is no distension.     Palpations: Abdomen is soft.     Tenderness: There is no abdominal tenderness.  Musculoskeletal:     Right lower leg: No edema.  Left lower leg: No edema.  Skin:    Findings: No erythema or rash.  Neurological:     Mental Status: She is alert and oriented to person, place, and time.       No results found for any visits on 10/26/22.  Assessment & Plan     Problem List Items Addressed This Visit       Cardiovascular and Mediastinum   Essential hypertension with goal blood pressure less than 140/90    Elevated systolic BP  2 elevated measurements today  Recommended patient follow up in 3 months for repeat check  She will continue current medications including bisoprolol-hydrochlorothiazide 6 5-6.25 mg daily, losartan 100 mg daily, hydrochlorothiazide 12.5 mg daily CMP ordered today       Relevant Orders   Comprehensive metabolic panel   Lipid panel     Endocrine   Hypothyroidism    Chronic, controlled She will continue levothyroxine 100 mg daily Check TSH today      Relevant Orders   TSH   Diabetes mellitus ([place]) - Primary    A1c has historically been within goal range, well-controlled Will repeat hemoglobin A1c today Diabetes foot exam completed today Patient scheduled for diabetes eye exam Urine microalbumin collected today  She will  continue metformin 500 mg daily and dietary management       Relevant Orders   Urine microalbumin-creatinine with uACR   Hemoglobin A1c   Lipid panel     Other   Dyslipidemia, goal LDL below 100    Chronic Lipid panel today Continue dietary management Continue atorvastatin '40mg'$  5 days/week, skipping Wednesdays and Sundays Follow-up in 3 months for repeat lipid panel         Return in about 3 months (around 01/25/2023) for cholesterol f/u and labs .       The entirety of the information documented in the History of Present Illness, Review of Systems and Physical Exam were personally obtained by me. Portions of this information were initially documented by Lyndel Pleasure, CMA and reviewed by me for thoroughness and accuracy. Wendy Foster, MD   Wendy Foster, MD  [facility] [phone] (phone) [phone] (fax)  [place]

## 2022-10-26 NOTE — Assessment & Plan Note (Signed)
Chronic Lipid panel today Continue dietary management Continue atorvastatin '40mg'$  5 days/week, skipping Wednesdays and Sundays Follow-up in 3 months for repeat lipid panel

## 2022-10-26 NOTE — Assessment & Plan Note (Addendum)
A1c has historically been within goal range, well-controlled Will repeat hemoglobin A1c today Diabetes foot exam completed today Patient scheduled for diabetes eye exam Urine microalbumin collected today  She will continue metformin 500 mg daily and dietary management

## 2022-10-26 NOTE — Assessment & Plan Note (Signed)
Chronic, controlled She will continue levothyroxine 100 mg daily Check TSH today

## 2022-10-27 LAB — COMPREHENSIVE METABOLIC PANEL WITH GFR
ALT: 37 [IU]/L — ABNORMAL HIGH (ref 0–32)
AST: 23 [IU]/L (ref 0–40)
Albumin/Globulin Ratio: 1.8 (ref 1.2–2.2)
Albumin: 4.3 g/dL (ref 3.8–4.8)
Alkaline Phosphatase: 81 [IU]/L (ref 44–121)
BUN/Creatinine Ratio: 23 (ref 12–28)
BUN: 23 mg/dL (ref 8–27)
Bilirubin Total: 0.7 mg/dL (ref 0.0–1.2)
CO2: 22 mmol/L (ref 20–29)
Calcium: 9.5 mg/dL (ref 8.7–10.3)
Chloride: 105 mmol/L (ref 96–106)
Creatinine, Ser: 1 mg/dL (ref 0.57–1.00)
Globulin, Total: 2.4 g/dL (ref 1.5–4.5)
Glucose: 127 mg/dL — ABNORMAL HIGH (ref 70–99)
Potassium: 4.1 mmol/L (ref 3.5–5.2)
Sodium: 143 mmol/L (ref 134–144)
Total Protein: 6.7 g/dL (ref 6.0–8.5)
eGFR: 58 mL/min/{1.73_m2} — ABNORMAL LOW

## 2022-10-27 LAB — HEMOGLOBIN A1C
Est. average glucose Bld gHb Est-mCnc: 151 mg/dL
Hgb A1c MFr Bld: 6.9 % — ABNORMAL HIGH (ref 4.8–5.6)

## 2022-10-27 LAB — LIPID PANEL
Chol/HDL Ratio: 3.7 ratio (ref 0.0–4.4)
Cholesterol, Total: 181 mg/dL (ref 100–199)
HDL: 49 mg/dL
LDL Chol Calc (NIH): 109 mg/dL — ABNORMAL HIGH (ref 0–99)
Triglycerides: 127 mg/dL (ref 0–149)
VLDL Cholesterol Cal: 23 mg/dL (ref 5–40)

## 2022-10-27 LAB — HM DIABETES EYE EXAM

## 2022-10-27 LAB — TSH: TSH: 1.75 u[IU]/mL (ref 0.450–4.500)

## 2022-10-28 LAB — MICROALBUMIN / CREATININE URINE RATIO
Creatinine, Urine: 67.5 mg/dL
Microalb/Creat Ratio: 6 mg/g{creat} (ref 0–29)
Microalbumin, Urine: 3.9 ug/mL

## 2022-10-30 ENCOUNTER — Ambulatory Visit: Payer: Medicare Other | Admitting: Family Medicine

## 2022-11-02 ENCOUNTER — Encounter: Payer: Self-pay | Admitting: Family Medicine

## 2022-11-20 ENCOUNTER — Ambulatory Visit (INDEPENDENT_AMBULATORY_CARE_PROVIDER_SITE_OTHER): Payer: Medicare Other | Admitting: Family Medicine

## 2022-11-20 ENCOUNTER — Encounter: Payer: Self-pay | Admitting: Family Medicine

## 2022-11-20 ENCOUNTER — Ambulatory Visit: Payer: Self-pay

## 2022-11-20 VITALS — BP 130/76 | HR 69 | Temp 97.8°F | Resp 24 | Wt 180.0 lb

## 2022-11-20 DIAGNOSIS — R051 Acute cough: Secondary | ICD-10-CM | POA: Diagnosis not present

## 2022-11-20 DIAGNOSIS — R062 Wheezing: Secondary | ICD-10-CM | POA: Diagnosis not present

## 2022-11-20 LAB — POC COVID19 BINAXNOW: SARS Coronavirus 2 Ag: NEGATIVE

## 2022-11-20 LAB — POCT INFLUENZA A/B
Influenza A, POC: NEGATIVE
Influenza B, POC: NEGATIVE

## 2022-11-20 MED ORDER — GUAIFENESIN-CODEINE 100-10 MG/5ML PO SOLN: 10.0000 mL | Freq: Three times a day (TID) | ORAL | 0 refills | Status: AC | PRN

## 2022-11-20 MED ORDER — PREDNISONE 20 MG PO TABS: 40.0000 mg | ORAL_TABLET | Freq: Every day | ORAL | 0 refills | Status: AC

## 2022-11-20 NOTE — Telephone Encounter (Signed)
Reviewed. Agree with office evaluation as scheduled.   [name], MD  [facility]

## 2022-11-20 NOTE — Progress Notes (Signed)
I,Sulibeya S Dimas,acting as a Education administrator for Lavon Paganini, MD.,have documented all relevant documentation on the behalf of Lavon Paganini, MD,as directed by  Lavon Paganini, MD while in the presence of Lavon Paganini, MD.   Established patient visit   Patient: Wendy Beck   DOB: 28-Jun-1946   76 y.o. Female  MRN: [id number] Visit Date: 11/20/2022  Today's healthcare provider: Lavon Paganini, MD   Chief Complaint  Patient presents with   URI   Subjective    HPI   Upper respiratory symptoms She complains of congestion, non productive cough, shortness of breath, and wheezing.with no fever, chills, night sweats or weight loss. Onset of symptoms was  4 days ago and gradually worsening.She is drinking plenty of fluids.  Past history is significant for no history of pneumonia or bronchitis. Patient is non-smoker  Happens 3-4 times per year typically.  A lot of known sick contacts (they all got abx and that is what she needs) inclusing some in school.  Albuterol not helpful. No h/o asthma, but wheezes when sick sometimes. ---------------------------------------------------------------------------------------------------   Medications: Outpatient Medications Prior to Visit  Medication Sig   albuterol (VENTOLIN HFA) 108 (90 Base) MCG/ACT inhaler Inhale 2 puffs into the lungs every 6 (six) hours as needed for wheezing or shortness of breath.   atorvastatin (LIPITOR) 40 MG tablet TAKE ONE TABLET BY MOUTH DAILY   b complex vitamins tablet Take 1 tablet by mouth daily with lunch.   benzonatate (TESSALON) 100 MG capsule Take 1 capsule (100 mg total) by mouth 2 (two) times daily as needed for cough.   bisoprolol-hydrochlorothiazide (ZIAC) 5-6.25 MG tablet Take 1 tablet by mouth daily.   blood glucose meter kit and supplies Dispense based on patient and insurance preference. Use Once daily as directed. (FOR ICD-10 E10.59).   cholecalciferol (VITAMIN D) 1000 units tablet Take  2,000 Units by mouth daily with lunch.    Coenzyme Q10 300 MG WAFR Take 300 mg by mouth daily with lunch.   Cyanocobalamin (VITAMIN B-12) 5000 MCG SUBL Place under the tongue daily.   diclofenac Sodium (VOLTAREN) 1 % GEL Apply 4 g topically 4 (four) times daily. As needed   doxycycline (PERIOSTAT) 20 MG tablet Take 1 tablet (20 mg total) by mouth daily. Take with evening meal. Take with food and drink.   fexofenadine-pseudoephedrine (ALLEGRA-D) 60-120 MG 12 hr tablet Take 1 tablet by mouth 2 (two) times daily.   Glucosamine Sulfate (DONA PO) Take 2 tablets by mouth daily with lunch.    hydrochlorothiazide (HYDRODIURIL) 12.5 MG tablet Take 1 tablet (12.5 mg total) by mouth daily.   levothyroxine (SYNTHROID) 100 MCG tablet TAKE 1 TABLET BY MOUTH ONCE DAILY BEFORE  BREAKFAST   losartan (COZAAR) 100 MG tablet Take 1 tablet (100 mg total) by mouth daily.   metFORMIN (GLUCOPHAGE-XR) 500 MG 24 hr tablet TAKE TWO TABLETS BY MOUTH DAILY   Methylsulfonylmethane 1000 MG TABS Take by oral route.   modafinil (PROVIGIL) 200 MG tablet Take 1 tablet (200 mg total) by mouth daily.   Multiple Vitamins-Minerals (HAIR VITAMINS PO) Take 2 tablets by mouth daily with lunch.    Omega-3 Fatty Acids (FISH OIL ULTRA) 1400 MG CAPS Take 4,200 mg by mouth daily at 2 PM.   potassium chloride (KLOR-CON) 10 MEQ tablet Take 10 mEq by mouth daily. As needed   No facility-administered medications prior to visit.    Review of Systems  HENT:  Positive for congestion.   Respiratory:  Positive for  cough, chest tightness, shortness of breath and wheezing.        Objective    BP 130/76 (BP Location: Left Arm, Patient Position: Sitting, Cuff Size: Large)   Pulse 69   Temp 97.8 F (36.6 C) (Temporal)   Resp (!) 24   Wt 180 lb (81.6 kg)   SpO2 96%   BMI 32.92 kg/m    Physical Exam Vitals reviewed.  Constitutional:      General: She is not in acute distress.    Appearance: Normal appearance. She is well-developed. She  is not diaphoretic.  HENT:     Head: Normocephalic and atraumatic.     Right Ear: Tympanic membrane, ear canal and external ear normal.     Left Ear: Tympanic membrane, ear canal and external ear normal.     Nose: Nose normal.     Mouth/Throat:     Mouth: Mucous membranes are moist.     Pharynx: Oropharynx is clear. No oropharyngeal exudate.  Eyes:     General: No scleral icterus.    Conjunctiva/sclera: Conjunctivae normal.     Pupils: Pupils are equal, round, and reactive to light.  Neck:     Thyroid: No thyromegaly.  Cardiovascular:     Rate and Rhythm: Normal rate and regular rhythm.     Heart sounds: Normal heart sounds. No murmur heard. Pulmonary:     Effort: Pulmonary effort is normal. No respiratory distress.     Breath sounds: Wheezing (diffusely) present. No rhonchi or rales.  Musculoskeletal:     Cervical back: Neck supple.     Right lower leg: No edema.     Left lower leg: No edema.  Lymphadenopathy:     Cervical: No cervical adenopathy.  Skin:    General: Skin is warm and dry.     Findings: No rash.  Neurological:     Mental Status: She is alert and oriented to person, place, and time. Mental status is at baseline.  Psychiatric:        Mood and Affect: Mood normal.        Behavior: Behavior normal.       Results for orders placed or performed in visit on 11/20/22  POC COVID-19  Result Value Ref Range   SARS Coronavirus 2 Ag Negative Negative  POCT Influenza A/B  Result Value Ref Range   Influenza A, POC Negative Negative   Influenza B, POC Negative Negative    Assessment & Plan     1. Acute cough 2. Wheeze - new problem - consider PFTs to investigate for asthma given significant wheeze on exam and this happening 3-4 times per year - will treat as asthma exac today with prednisone and albuterol - cough syurup Rx as well - return precautions discussed - POC COVID-19 - POCT Influenza A/B  Meds ordered this encounter  Medications    guaiFENesin-codeine 100-10 MG/5ML syrup    Sig: Take 10 mLs by mouth 3 (three) times daily as needed for cough.    Dispense:  120 mL    Refill:  0   predniSONE (DELTASONE) 20 MG tablet    Sig: Take 2 tablets (40 mg total) by mouth daily with breakfast for 7 days.    Dispense:  14 tablet    Refill:  0     Return if symptoms worsen or fail to improve.      I, Lavon Paganini, MD, have reviewed all documentation for this visit. The documentation on 11/20/22 for the exam, diagnosis,  procedures, and orders are all accurate and complete.   Lateisha Thurlow, Dionne Bucy, MD, MPH [facility]

## 2022-11-20 NOTE — Telephone Encounter (Signed)
Chief Complaint: SOB, coughing Symptoms: coughing, Chest congestion Frequency: onset Friday Pertinent Negatives: Patient denies chest pain Disposition: [x]$ ED /[]$ Urgent Care (no appt availability in office) / []$ Appointment(In office/virtual)/ []$  [facility]/ []$ Home Care/ [x]$ Refused Recommended Disposition /[]$ [facility]/ []$  Follow-up with PCP Additional Notes: Patient reports SOB w/lying flat and with coughing, says it feels like she' has a lot of mucus that will not come up. She coughs with every few words and says it's worse with talking, could hear the wheezing while talking. I advised ED, she refused saying she doesn't want to sit all day, she asked me to schedule appointment. I don't feel comfortable scheduling the patient with the symptoms and the way she sounds to me. I called the office and spoke to [place], [facility] advising I do not want to schedule the patient with the symptoms mentioned, however I would go ahead and schedule if that's what is advised. She placed me on hold, then came back to the phone to ask to speak to the patient, the call was connected successfully.   Reason for Disposition  [1] MODERATE difficulty breathing (e.g., speaks in phrases, SOB even at rest, pulse 100-120) AND [2] NEW-onset or WORSE than normal  Answer Assessment - Initial Assessment Questions 1. RESPIRATORY STATUS: "Describe your breathing?" (e.g., wheezing, shortness of breath, unable to speak, severe coughing)      Coughing, SOB 2. ONSET: "When did this breathing problem begin?"      Friday night 3. PATTERN "Does the difficult breathing come and go, or has it been constant since it started?"      Constant with coughing and lying down 4. SEVERITY: "How bad is your breathing?" (e.g., mild, moderate, severe)    - MILD: No SOB at rest, mild SOB with walking, speaks normally in sentences, can lie down, no retractions, pulse < 100.    - MODERATE: SOB at rest, SOB with minimal exertion and  prefers to sit, cannot lie down flat, speaks in phrases, mild retractions, audible wheezing, pulse 100-120.    - SEVERE: Very SOB at rest, speaks in single words, struggling to breathe, sitting hunched forward, retractions, pulse > 120      Moderate-severe with coughing 5. RECURRENT SYMPTOM: "Have you had difficulty breathing before?" If Yes, ask: "When was the last time?" and "What happened that time?"      N/A 6. CARDIAC HISTORY: "Do you have any history of heart disease?" (e.g., heart attack, angina, bypass surgery, angioplasty)      Yes 7. LUNG HISTORY: "Do you have any history of lung disease?"  (e.g., pulmonary embolus, asthma, emphysema)     No 8. CAUSE: "What do you think is causing the breathing problem?"      N/A 9. OTHER SYMPTOMS: "Do you have any other symptoms? (e.g., dizziness, runny nose, cough, chest pain, fever)     Sinus congestion, cough 10. O2 SATURATION MONITOR:  "Do you use an oxygen saturation monitor (pulse oximeter) at home?" If Yes, ask: "What is your reading (oxygen level) today?" "What is your usual oxygen saturation reading?" (e.g., 95%)       N/A  Protocols used: Breathing Difficulty-A-AH

## 2022-12-06 ENCOUNTER — Other Ambulatory Visit: Payer: Self-pay | Admitting: Family Medicine

## 2022-12-06 DIAGNOSIS — E039 Hypothyroidism, unspecified: Secondary | ICD-10-CM

## 2022-12-06 DIAGNOSIS — G47419 Narcolepsy without cataplexy: Secondary | ICD-10-CM

## 2022-12-06 DIAGNOSIS — I10 Essential (primary) hypertension: Secondary | ICD-10-CM

## 2022-12-06 NOTE — Telephone Encounter (Signed)
Unable to refill per protocol, Rx requests are too soon.  Requested Prescriptions  Pending Prescriptions Disp Refills   levothyroxine (SYNTHROID) 100 MCG tablet 90 tablet 0    Sig: Take 1 tablet (100 mcg total) by mouth daily before breakfast.     Endocrinology:  Hypothyroid Agents Passed - 12/06/2022  3:05 PM      Passed - TSH in normal range and within 360 days    TSH  Date Value Ref Range Status  10/26/2022 1.750 0.450 - 4.500 uIU/mL Final         Passed - Valid encounter within last 12 months    Recent Outpatient Visits           2 weeks ago Acute cough   [place], Dionne Bucy, MD   1 month ago Type 2 diabetes mellitus with other circulatory complication, without Grundman-term current use of insulin ([place])   [place] Simmons-Robinson, [place], MD   4 months ago Acute bronchitis, unspecified organism   [facility] [name], PA-C   7 months ago Encounter for [employer] annual wellness exam   [facility] Eulas Post, MD   11 months ago Type 2 diabetes mellitus with other circulatory complication, without [place]-term current use of insulin [facility])   [place] Eulas Post, MD       Future Appointments             In 1 month Ralene Bathe, MD [place]   In 1 month Simmons-Robinson, [place], MD [facility], [facility]             hydrochlorothiazide (HYDRODIURIL) 12.5 MG tablet 90 tablet 1    Sig: Take 1 tablet (12.5 mg total) by mouth daily.     Cardiovascular: Diuretics - Thiazide Passed - 12/06/2022  3:05 PM      Passed - Cr in normal range and within 180 days    Creatinine, Ser  Date Value Ref Range Status  10/26/2022 1.00 0.57 - 1.00 mg/dL Final         Passed - K in normal range and within 180 days    Potassium  Date Value Ref Range Status  10/26/2022 4.1  3.5 - 5.2 mmol/L Final         Passed - Na in normal range and within 180 days    Sodium  Date Value Ref Range Status  10/26/2022 143 134 - 144 mmol/L Final         Passed - Last BP in normal range    BP Readings from Last 1 Encounters:  11/20/22 130/76         Passed - Valid encounter within last 6 months    Recent Outpatient Visits           2 weeks ago Acute cough   [place], Dionne Bucy, MD   1 month ago Type 2 diabetes mellitus with other circulatory complication, without Botero-term current use of insulin ([place])   [place] Simmons-Robinson, Gorman, MD   4 months ago Acute bronchitis, unspecified organism   [facility] [name], PA-C   7 months ago Encounter for [employer] annual wellness exam   [facility] Eulas Post, MD   11 months ago Type 2 diabetes mellitus with other circulatory complication, without  L[place]-term current use of insulin [facility])   [place], MD       Future Appointments             In 1 month Nehemiah Massed Monia Sabal, MD [place]   In 1 month Simmons-Robinson, [place], MD [facility]             bisoprolol-hydrochlorothiazide [facility]) 5-6.25 MG tablet 90 tablet 3    Sig: Take 1 tablet by mouth daily.     Cardiovascular: Beta Blocker + Diuretic Combos Failed - 12/06/2022  3:05 PM      Failed - eGFR in normal range and within 180 days    GFR calc Af Amer  Date Value Ref Range Status  11/17/2020 77 >59 mL/min/1.73 Final    Comment:    **In accordance with recommendations from the NKF-ASN Task force,**   [facility] is in the process of updating its eGFR calculation to the   2021 CKD-EPI creatinine equation that estimates kidney function   without a race variable.    GFR calc non Af Amer  Date Value Ref Range Status   11/17/2020 67 >59 mL/min/1.73 Final   eGFR  Date Value Ref Range Status  10/26/2022 58 (L) >59 mL/min/1.73 Final         Passed - K in normal range and within 180 days    Potassium  Date Value Ref Range Status  10/26/2022 4.1 3.5 - 5.2 mmol/L Final         Passed - Na in normal range and within 180 days    Sodium  Date Value Ref Range Status  10/26/2022 143 134 - 144 mmol/L Final         Passed - Cr in normal range and within 180 days    Creatinine, Ser  Date Value Ref Range Status  10/26/2022 1.00 0.57 - 1.00 mg/dL Final         Passed - Last BP in normal range    BP Readings from Last 1 Encounters:  11/20/22 130/76         Passed - Last Heart Rate in normal range    Pulse Readings from Last 1 Encounters:  11/20/22 69         Passed - Valid encounter within last 6 months    Recent Outpatient Visits           2 weeks ago Acute cough   [place], Dionne Bucy, MD   1 month ago Type 2 diabetes mellitus with other circulatory complication, without [place]-term current use of insulin ([place])   [place] Simmons-Robinson, [place], MD   4 months ago Acute bronchitis, unspecified organism   [facility] [name], PA-C   7 months ago Encounter for [employer] annual wellness exam   [facility] Eulas Post, MD   11 months ago Type 2 diabetes mellitus with other circulatory complication, without Vall[place]-term current use of insulin [facility])   [place], [place], MD       Future Appointments             In 1 month Ralene Bathe, MD [place]   In 1 month Simmons-Robinson, Riki Sheer, MD [facility], [facility]             modafinil (PROVIGIL)  200 MG tablet 90 tablet 1    Sig: Take 1 tablet (200 mg total) by mouth daily.     Off-Protocol Failed - 12/06/2022  3:05 PM       Failed - Medication not assigned to a protocol, review manually.      Passed - Valid encounter within last 12 months    Recent Outpatient Visits           2 weeks ago Acute cough   [place], Dionne Bucy, MD   1 month ago Type 2 diabetes mellitus with other circulatory complication, without [place]ke-term current use of insulin ([place])   [place] Simmons-Robinson, [place]on[place], MD   4 months ago Acute bronchitis, unspecified organism   [facility] [name], PA-C   7 months ago Encounter for [employer] annual wellness exam   [facility] Eulas Post, MD   11 months ago Type 2 diabetes mellitus with other circulatory complication, without [place]-term current use of insulin [facility])   [place] Eulas Post, MD       Future Appointments             In 1 month Ralene Bathe, MD [place]   In 1 month Simmons-Robinson, Riki Sheer, MD [facility], [place]

## 2022-12-06 NOTE — Telephone Encounter (Signed)
[place] called and spoke to Amid, [facility] about the question on bisoprolol-HCTZ 5-6.25 mgand HCTZ 12.5 mg. Advised the provider added HCTZ 12.5 mg on 12/28/21 and the current provider is continuing based on high BP readings during the OV's. He verbalized understanding.

## 2022-12-06 NOTE — Telephone Encounter (Signed)
Medication Refill - Medication: levothyroxine (SYNTHROID) 100 MCG tablet , bisoprolol-hydrochlorothiazide (ZIAC) 5-6.25 MG tablet , hydrochlorothiazide (HYDRODIURIL) 12.5 MG tablet , modafinil (PROVIGIL) 200 MG tablet   Has the patient contacted their pharmacy? Yes.    Preferred Pharmacy (with phone number or street name):  [place] [place] Phone: [phone]  Fax: [phone]      Has the patient been seen for an appointment in the last year OR does the patient have an upcoming appointment? Yes.    The pharmacist is wanting clarification as too both medicines that contain hydrochlorothiazide. The  (HYDRODIURIL) 12.5 MG tablet  and the bisoprolol-hydrochlorothiazide (ZIAC) 5-6.25 MG tablet  Does she really need to take both as they were both prescribed last year by Dr Rosanna Randy? Please assist further

## 2022-12-12 ENCOUNTER — Other Ambulatory Visit: Payer: Self-pay | Admitting: Family Medicine

## 2022-12-12 DIAGNOSIS — G47419 Narcolepsy without cataplexy: Secondary | ICD-10-CM

## 2022-12-12 DIAGNOSIS — E039 Hypothyroidism, unspecified: Secondary | ICD-10-CM

## 2022-12-12 DIAGNOSIS — I10 Essential (primary) hypertension: Secondary | ICD-10-CM

## 2022-12-12 NOTE — Telephone Encounter (Unsigned)
Copied from [place] [id number]. Topic: General - Other >> Dec 12, 2022  4:00 PM Everette C wrote: Reason for CRM: Medication Refill - Medication: levothyroxine (SYNTHROID) 100 MCG tablet [id number]  bisoprolol-hydrochlorothiazide (ZIAC) 5-6.25 MG tablet [id number]  hydrochlorothiazide (HYDRODIURIL) 12.5 MG tablet [id number]  modafinil (PROVIGIL) 200 MG tablet [id number]  losartan (COZAAR) 100 MG tablet [id number]  The patient would like a 90 day supply of their medications   Has the patient contacted their pharmacy? Yes.   (Agent: If no, request that the patient contact the pharmacy for the refill. If patient does not wish to contact the pharmacy document the reason why and proceed with request.) (Agent: If yes, when and what did the pharmacy advise?)  Preferred Pharmacy (with phone number or street name): [place] [address] Phone: [phone] Fax: [phone] Hours: Not open 24 hours   Has the patient been seen for an appointment in the last year OR does the patient have an upcoming appointment? Yes.    Agent: Please be advised that RX refills may take up to 3 business days. We ask that you follow-up with your pharmacy.

## 2022-12-13 MED ORDER — MODAFINIL 200 MG PO TABS: 200.0000 mg | ORAL_TABLET | Freq: Every day | ORAL | 0 refills | Status: DC

## 2022-12-13 MED ORDER — LOSARTAN POTASSIUM 100 MG PO TABS: 100.0000 mg | ORAL_TABLET | Freq: Every day | ORAL | 0 refills | Status: DC

## 2022-12-13 MED ORDER — BISOPROLOL-HYDROCHLOROTHIAZIDE 5-6.25 MG PO TABS: 1.0000 | ORAL_TABLET | Freq: Every day | ORAL | 0 refills | Status: DC

## 2022-12-13 MED ORDER — LEVOTHYROXINE SODIUM 100 MCG PO TABS: 100.0000 ug | ORAL_TABLET | Freq: Every day | ORAL | 1 refills | Status: AC

## 2022-12-13 NOTE — Telephone Encounter (Signed)
Requested Prescriptions  Pending Prescriptions Disp Refills   levothyroxine (SYNTHROID) 100 MCG tablet 90 tablet 1    Sig: Take 1 tablet (100 mcg total) by mouth daily before breakfast.     Endocrinology:  Hypothyroid Agents Passed - 12/12/2022  4:36 PM      Passed - TSH in normal range and within 360 days    TSH  Date Value Ref Range Status  10/26/2022 1.750 0.450 - 4.500 uIU/mL Final         Passed - Valid encounter within last 12 months    Recent Outpatient Visits           3 weeks ago Acute cough   [place], Dionne Bucy, MD   1 month ago Type 2 diabetes mellitus with other circulatory complication, without Tsao-term current use of insulin ([place])   [place] Simmons-Robinson, [place], MD   4 months ago Acute bronchitis, unspecified organism   [place] [facility] [name], PA-C   7 months ago Encounter for [employer] annual wellness exam   [facility] Eulas Post, MD   11 months ago Type 2 diabetes mellitus with other circulatory complication, without [place]-term current use of insulin [facility])   [place], MD       Future Appointments             In 3 weeks Ralene Bathe, MD [place]   In 1 month Simmons-Robinson, [place], MD [facility], [facility]             bisoprolol-hydrochlorothiazide [facility]) 5-6.25 MG tablet 90 tablet 0    Sig: Take 1 tablet by mouth daily.     Cardiovascular: Beta Blocker + Diuretic Combos Failed - 12/12/2022  4:36 PM      Failed - eGFR in normal range and within 180 days    GFR calc Af Amer  Date Value Ref Range Status  11/17/2020 77 >59 mL/min/1.73 Final    Comment:    **In accordance with recommendations from the NKF-ASN Task force,**   [facility] is in the process of updating its eGFR calculation to the   2021 CKD-EPI  creatinine equation that estimates kidney function   without a race variable.    GFR calc non Af Amer  Date Value Ref Range Status  11/17/2020 67 >59 mL/min/1.73 Final   eGFR  Date Value Ref Range Status  10/26/2022 58 (L) >59 mL/min/1.73 Final         Passed - K in normal range and within 180 days    Potassium  Date Value Ref Range Status  10/26/2022 4.1 3.5 - 5.2 mmol/L Final         Passed - Na in normal range and within 180 days    Sodium  Date Value Ref Range Status  10/26/2022 143 134 - 144 mmol/L Final         Passed - Cr in normal range and within 180 days    Creatinine, Ser  Date Value Ref Range Status  10/26/2022 1.00 0.57 - 1.00 mg/dL Final         Passed - Last BP in normal range    BP Readings from Last 1 Encounters:  11/20/22 130/76         Passed - Last Heart Rate in normal range    Pulse Readings from Last 1 Encounters:  11/20/22 69         Passed - Valid encounter within last 6 months    Recent Outpatient Visits           3 weeks ago Acute cough   [place], Dionne Bucy, MD   1 month ago Type 2 diabetes mellitus with other circulatory complication, without [place]-term current use of insulin ([place])   [place] Simmons-Robinson, [place], MD   4 months ago Acute bronchitis, unspecified organism   [facility] [name], PA-C   7 months ago Encounter for [employer] annual wellness exam   [facility] Eulas Post, MD   11 months ago Type 2 diabetes mellitus with other circulatory complication, without K[place]-term current use of insulin [facility])   [place], MD       Future Appointments             In 3 weeks Ralene Bathe, MD [place]   In 1 month Simmons-Robinson, [place], MD [facility], [facility]             modafinil  (PROVIGIL) 200 MG tablet 90 tablet 1    Sig: Take 1 tablet (200 mg total) by mouth daily.     Off-Protocol Failed - 12/12/2022  4:36 PM      Failed - Medication not assigned to a protocol, review manually.      Passed - Valid encounter within last 12 months    Recent Outpatient Visits           3 weeks ago Acute cough   [place], Dionne Bucy, MD   1 month ago Type 2 diabetes mellitus with other circulatory complication, without [place]-term current use of insulin ([place])   [place] Simmons-Robinson, [place], MD   4 months ago Acute bronchitis, unspecified organism   [facility] [name], PA-C   7 months ago Encounter for [employer] annual wellness exam   [facility] Eulas Post, MD   11 months ago Type 2 diabetes mellitus with other circulatory complication, without Raucci-term current use of insulin [facility])   [place], MD       Future Appointments             In 3 weeks Ralene Bathe, MD [place]   In 1 month Simmons-Robinson, [place], MD [facility], [facility]             losartan (COZAAR) 100 MG tablet 90 tablet 0    Sig: Take 1 tablet (100 mg total) by mouth daily.     Cardiovascular:  Angiotensin Receptor Blockers Passed - 12/12/2022  4:36 PM      Passed - Cr in normal range and within 180 days    Creatinine, Ser  Date Value Ref Range Status  10/26/2022 1.00 0.57 - 1.00 mg/dL Final         Passed - K in normal range and within 180 days    Potassium  Date Value Ref Range Status  10/26/2022 4.1 3.5 - 5.2 mmol/L Final         Passed - Patient is not pregnant      Passed - Last BP in normal range    BP Readings  from Last 1 Encounters:  11/20/22 130/76         Passed - Valid encounter within last 6 months    Recent Outpatient Visits            3 weeks ago Acute cough   [place], Dionne Bucy, MD   1 month ago Type 2 diabetes mellitus with other circulatory complication, without [place]is-term current use of insulin ([place])   [place] Simmons-Robinson, [place]cy, MD   4 months ago Acute bronchitis, unspecified organism   [facility] [name], PA-C   7 months ago Encounter for [employer] annual wellness exam   [facility] Eulas Post, MD   11 months ago Type 2 diabetes mellitus with other circulatory complication, without [place]ugg[place]-term current use of insulin [facility])   [place] Eulas Post, MD       Future Appointments             In 3 weeks Ralene Bathe, MD [place]   In 1 month Simmons-Robinson, Riki Sheer, MD [facility], [place]

## 2022-12-13 NOTE — Telephone Encounter (Signed)
Requested medication (s) are due for refill today: yes  Requested medication (s) are on the active medication list: yes  Last refill:  08/15/22 #90/1  Future visit scheduled: yes  Notes to clinic:  rx was previously sent to [facility] and now pt is using [employer]. Please send remaining refill.      Requested Prescriptions  Pending Prescriptions Disp Refills   modafinil (PROVIGIL) 200 MG tablet 90 tablet 1    Sig: Take 1 tablet (200 mg total) by mouth daily.     Off-Protocol Failed - 12/12/2022  4:36 PM      Failed - Medication not assigned to a protocol, review manually.      Passed - Valid encounter within last 12 months    Recent Outpatient Visits           3 weeks ago Acute cough   [place], Dionne Bucy, MD   1 month ago Type 2 diabetes mellitus with other circulatory complication, without [place]-term current use of insulin ([place])   [place] Simmons-Robinson, [place]bins, MD   4 months ago Acute bronchitis, unspecified organism   [facility] [name], PA-C   7 months ago Encounter for [employer] annual wellness exam   [facility] Eulas Post, MD   11 months ago Type 2 diabetes mellitus with other circulatory complication, without [place]-term current use of insulin [facility])   [place], [place], MD       Future Appointments             In 3 weeks Ralene Bathe, MD [place]   In 1 month Simmons-Robinson, [place], MD [facility]            Signed Prescriptions Disp Refills   levothyroxine (SYNTHROID) 100 MCG tablet 90 tablet 1    Sig: Take 1 tablet (100 mcg total) by mouth daily before breakfast.     Endocrinology:  Hypothyroid Agents Passed - 12/12/2022  4:36 PM      Passed - TSH in normal range and within 360 days    TSH  Date Value Ref  Range Status  10/26/2022 1.750 0.450 - 4.500 uIU/mL Final         Passed - Valid encounter within last 12 months    Recent Outpatient Visits           3 weeks ago Acute cough   [place], Dionne Bucy, MD   1 month ago Type 2 diabetes mellitus with other circulatory complication, without [place]-term current use of insulin ([place])   [place] Simmons-Robinson, [place], MD   4 months ago Acute bronchitis, unspecified organism   [facility] [name], PA-C   7 months ago Encounter for [employer] annual wellness exam   [facility] Eulas Post, MD   11 months ago Type 2 diabetes mellitus with other circulatory complication, without [place]-term current use of insulin [facility])   [place] Eulas Post, MD       Future Appointments             In 3 weeks Ralene Bathe, MD [place]   In 1 month Simmons-Robinson, Riki Sheer, MD [facility] [place]  bisoprolol-hydrochlorothiazide (ZIAC) 5-6.25 MG tablet 90 tablet 0    Sig: Take 1 tablet by mouth daily.     Cardiovascular: Beta Blocker + Diuretic Combos Failed - 12/12/2022  4:36 PM      Failed - eGFR in normal range and within 180 days    GFR calc Af Amer  Date Value Ref Range Status  11/17/2020 77 >59 mL/min/1.73 Final    Comment:    **In accordance with recommendations from the NKF-ASN Task force,**   [facility] is in the process of updating its eGFR calculation to the   2021 CKD-EPI creatinine equation that estimates kidney function   without a race variable.    GFR calc non Af Amer  Date Value Ref Range Status  11/17/2020 67 >59 mL/min/1.73 Final   eGFR  Date Value Ref Range Status  10/26/2022 58 (L) >59 mL/min/1.73 Final         Passed - K in normal range and within 180 days    Potassium  Date Value Ref  Range Status  10/26/2022 4.1 3.5 - 5.2 mmol/L Final         Passed - Na in normal range and within 180 days    Sodium  Date Value Ref Range Status  10/26/2022 143 134 - 144 mmol/L Final         Passed - Cr in normal range and within 180 days    Creatinine, Ser  Date Value Ref Range Status  10/26/2022 1.00 0.57 - 1.00 mg/dL Final         Passed - Last BP in normal range    BP Readings from Last 1 Encounters:  11/20/22 130/76         Passed - Last Heart Rate in normal range    Pulse Readings from Last 1 Encounters:  11/20/22 69         Passed - Valid encounter within last 6 months    Recent Outpatient Visits           3 weeks ago Acute cough   [place], Dionne Bucy, MD   1 month ago Type 2 diabetes mellitus with other circulatory complication, without [place]-term current use of insulin ([place])   [place] Simmons-Robinson, [place], MD   4 months ago Acute bronchitis, unspecified organism   [facility] [name], PA-C   7 months ago Encounter for [employer] annual wellness exam   [facility] Eulas Post, MD   11 months ago Type 2 diabetes mellitus with other circulatory complication, without Jo[place]-term current use of insulin ([place])   [place], MD       Future Appointments             In 3 weeks Ralene Bathe, MD [place]   In 1 month Simmons-Robinson, [place], MD [facility], [facility]             losartan (COZAAR) 100 MG tablet 90 tablet 0    Sig: Take 1 tablet (100 mg total) by mouth daily.     Cardiovascular:  Angiotensin Receptor Blockers Passed - 12/12/2022  4:36 PM      Passed - Cr in normal range and within 180 days    Creatinine, Ser  Date Value Ref Range Status  10/26/2022 1.00 0.57 - 1.00 mg/dL Final  Passed - K in  normal range and within 180 days    Potassium  Date Value Ref Range Status  10/26/2022 4.1 3.5 - 5.2 mmol/L Final         Passed - Patient is not pregnant      Passed - Last BP in normal range    BP Readings from Last 1 Encounters:  11/20/22 130/76         Passed - Valid encounter within last 6 months    Recent Outpatient Visits           3 weeks ago Acute cough   [place], Dionne Bucy, MD   1 month ago Type 2 diabetes mellitus with other circulatory complication, without [place]-term current use of insulin ([place])   [place] Simmons-Robinson, [place], MD   4 months ago Acute bronchitis, unspecified organism   [facility] [name], PA-C   7 months ago Encounter for [employer] annual wellness exam   [facility] Eulas Post, MD   11 months ago Type 2 diabetes mellitus with other circulatory complication, without [place]-term current use of insulin [facility])   [place] Eulas Post, MD       Future Appointments             In 3 weeks Ralene Bathe, MD [place]   In 1 month Simmons-Robinson, Riki Sheer, MD [facility], [place]

## 2022-12-14 ENCOUNTER — Telehealth: Payer: Self-pay | Admitting: Family Medicine

## 2022-12-14 ENCOUNTER — Other Ambulatory Visit: Payer: Self-pay | Admitting: Family Medicine

## 2022-12-14 DIAGNOSIS — I10 Essential (primary) hypertension: Secondary | ICD-10-CM

## 2022-12-14 DIAGNOSIS — G47419 Narcolepsy without cataplexy: Secondary | ICD-10-CM

## 2022-12-14 MED ORDER — HYDROCHLOROTHIAZIDE 12.5 MG PO TABS: 12.5000 mg | ORAL_TABLET | Freq: Every day | ORAL | 1 refills | Status: DC

## 2022-12-14 NOTE — Telephone Encounter (Signed)
Patient requestilng 90 day supply of  modafinil (PROVIGIL) 200 MG tablet because its less expensive with insurance.

## 2022-12-14 NOTE — Telephone Encounter (Signed)
Patient would like 90 supply, it is cheaper

## 2022-12-14 NOTE — Telephone Encounter (Signed)
Medication Refill - Medication: hydrochlorothiazide (HYDRODIURIL) 12.5 MG tablet  Shows received at pharmacy but pt says they don't have it  Has the patient contacted their pharmacy? Yes   (Agent: If no, request that the patient contact the pharmacy for the refill. If patient does not wish to contact the pharmacy document the reason why and proceed with request.) (Agent: If yes, when and what did the pharmacy advise?)contact pcp  Preferred Pharmacy (with phone number or street name):  [place] Phone: [phone]  Fax: [phone]     Has the patient been seen for an appointment in the last year OR does the patient have an upcoming appointment? yes  Agent: Please be advised that RX refills may take up to 3 business days. We ask that you follow-up with your pharmacy.

## 2022-12-14 NOTE — Telephone Encounter (Signed)
Pt is calling to report that modafinil (PROVIGIL) 200 MG tablet [id number] 30 pills were sent in. Pt is needing 90 day script. Pt is requesting if a call be made to add 60 pills can be sent to the current script. Please advise  CB- [phone]

## 2022-12-18 ENCOUNTER — Other Ambulatory Visit: Payer: Self-pay | Admitting: Family Medicine

## 2022-12-18 ENCOUNTER — Telehealth: Payer: Self-pay | Admitting: Family Medicine

## 2022-12-18 DIAGNOSIS — G47419 Narcolepsy without cataplexy: Secondary | ICD-10-CM

## 2022-12-18 MED ORDER — MODAFINIL 200 MG PO TABS: 200.0000 mg | ORAL_TABLET | Freq: Every day | ORAL | 0 refills | Status: DC

## 2022-12-18 NOTE — Telephone Encounter (Signed)
New RX for 90tabs was written

## 2022-12-18 NOTE — Telephone Encounter (Signed)
Patient needs call back about her medication Modafinil 200 mg Oral Daily  Stated she currently [facility] tablets but the prescription should have been written for 90 tablets as it always has been.  She would like to know if you will send in another script for 60 more or just have a whole new one written for 90.

## 2022-12-28 ENCOUNTER — Other Ambulatory Visit: Payer: Self-pay

## 2022-12-28 DIAGNOSIS — E785 Hyperlipidemia, unspecified: Secondary | ICD-10-CM

## 2022-12-28 MED ORDER — ATORVASTATIN CALCIUM 40 MG PO TABS: 40.0000 mg | ORAL_TABLET | Freq: Every day | ORAL | 1 refills | Status: DC

## 2022-12-29 ENCOUNTER — Other Ambulatory Visit: Payer: Self-pay

## 2022-12-29 DIAGNOSIS — E1159 Type 2 diabetes mellitus with other circulatory complications: Secondary | ICD-10-CM

## 2022-12-29 MED ORDER — METFORMIN HCL ER 500 MG PO TB24: ORAL_TABLET | ORAL | 1 refills | Status: AC

## 2023-01-09 ENCOUNTER — Ambulatory Visit (INDEPENDENT_AMBULATORY_CARE_PROVIDER_SITE_OTHER): Payer: Medicare Other | Admitting: Dermatology

## 2023-01-09 VITALS — BP 122/61

## 2023-01-09 DIAGNOSIS — Z85828 Personal history of other malignant neoplasm of skin: Secondary | ICD-10-CM | POA: Diagnosis not present

## 2023-01-09 DIAGNOSIS — L820 Inflamed seborrheic keratosis: Secondary | ICD-10-CM | POA: Diagnosis not present

## 2023-01-09 DIAGNOSIS — Z79899 Other long term (current) drug therapy: Secondary | ICD-10-CM

## 2023-01-09 DIAGNOSIS — L2089 Other atopic dermatitis: Secondary | ICD-10-CM | POA: Diagnosis not present

## 2023-01-09 DIAGNOSIS — L821 Other seborrheic keratosis: Secondary | ICD-10-CM | POA: Diagnosis not present

## 2023-01-09 DIAGNOSIS — L578 Other skin changes due to chronic exposure to nonionizing radiation: Secondary | ICD-10-CM | POA: Diagnosis not present

## 2023-01-09 DIAGNOSIS — Z8589 Personal history of malignant neoplasm of other organs and systems: Secondary | ICD-10-CM

## 2023-01-09 MED ORDER — MOMETASONE FUROATE 0.1 % EX CREA: 1.0000 | TOPICAL_CREAM | Freq: Every day | CUTANEOUS | 0 refills | Status: AC | PRN

## 2023-01-09 NOTE — Patient Instructions (Signed)
Cryotherapy Aftercare  Wash gently with soap and water everyday.   Apply Vaseline and Band-Aid daily until healed.     Due to recent changes in healthcare laws, you may see results of your pathology and/or laboratory studies on MyChart before the doctors have had a chance to review them. We understand that in some cases there may be results that are confusing or concerning to you. Please understand that not all results are received at the same time and often the doctors may need to interpret multiple results in order to provide you with the best plan of care or course of treatment. Therefore, we ask that you please give us 2 business days to thoroughly review all your results before contacting the office for clarification. Should we see a critical lab result, you will be contacted sooner.   If You Need Anything After Your Visit  If you have any questions or concerns for your doctor, please call our main line at [phone] and press option 4 to reach your doctor's medical assistant. If no one answers, please leave a voicemail as directed and we will return your call as soon as possible. Messages left after 4 pm will be answered the following business day.   You may also send us a message via MyChart. We typically respond to MyChart messages within 1-2 business days.  For prescription refills, please ask your pharmacy to contact our office. Our fax number is [phone].  If you have an urgent issue when the clinic is closed that cannot wait until the next business day, you can page your doctor at the number below.    Please note that while we do our best to be available for urgent issues outside of office hours, we are not available 24/7.   If you have an urgent issue and are unable to reach us, you may choose to seek medical care at your doctor's office, retail clinic, urgent care center, or emergency room.  If you have a medical emergency, please immediately call 911 or go to the  emergency department.  Pager Numbers  - Dr. Kowalski: [phone]  - Dr. Moye: [phone]  - Dr. Stewart: [phone]  In the event of inclement weather, please call our main line at [phone] for an update on the status of any delays or closures.  Dermatology Medication Tips: Please keep the boxes that topical medications come in in order to help keep track of the instructions about where and how to use these. Pharmacies typically print the medication instructions only on the boxes and not directly on the medication tubes.   If your medication is too expensive, please contact our office at [phone] option 4 or send us a message through MyChart.   We are unable to tell what your co-pay for medications will be in advance as this is different depending on your insurance coverage. However, we may be able to find a substitute medication at lower cost or fill out paperwork to get insurance to cover a needed medication.   If a prior authorization is required to get your medication covered by your insurance company, please allow us 1-2 business days to complete this process.  Drug prices often vary depending on where the prescription is filled and some pharmacies may offer cheaper prices.  The website www.goodrx.com contains coupons for medications through different pharmacies. The prices here do not account for what the cost may be with help from insurance (it may be cheaper with your insurance), but the website can   give you the price if you did not use any insurance.  - You can print the associated coupon and take it with your prescription to the pharmacy.  - You may also stop by our office during regular business hours and pick up a GoodRx coupon card.  - If you need your prescription sent electronically to a different pharmacy, notify our office through [facility] MyChart or by phone at [phone] option 4.     Si Usted Necesita Algo Despus de Su Visita  Tambin puede  enviarnos un mensaje a travs de MyChart. Por lo general respondemos a los mensajes de MyChart en el transcurso de 1 a 2 das hbiles.  Para renovar recetas, por favor pida a su farmacia que se ponga en contacto con nuestra oficina. Nuestro nmero de fax es el [phone].  Si tiene un asunto urgente cuando la clnica est cerrada y que no puede esperar hasta el siguiente da hbil, puede llamar/localizar a su doctor(a) al nmero que aparece a continuacin.   Por favor, tenga en cuenta que aunque hacemos todo lo posible para estar disponibles para asuntos urgentes fuera del horario de oficina, no estamos disponibles las 24 horas del da, los 7 das de la semana.   Si tiene un problema urgente y no puede comunicarse con nosotros, puede optar por buscar atencin mdica  en el consultorio de su doctor(a), en una clnica privada, en un centro de atencin urgente o en una sala de emergencias.  Si tiene una emergencia mdica, por favor llame inmediatamente al 911 o vaya a la sala de emergencias.  Nmeros de bper  - Dr. Kowalski: [phone]  - Dra. Moye: [phone]  - Dra. Stewart: [phone]  En caso de inclemencias del tiempo, por favor llame a nuestra lnea principal al [phone] para una actualizacin sobre el estado de cualquier retraso o cierre.  Consejos para la medicacin en dermatologa: Por favor, guarde las cajas en las que vienen los medicamentos de uso tpico para ayudarle a seguir las instrucciones sobre dnde y cmo usarlos. Las farmacias generalmente imprimen las instrucciones del medicamento slo en las cajas y no directamente en los tubos del medicamento.   Si su medicamento es muy caro, por favor, pngase en contacto con nuestra oficina llamando al [phone] y presione la opcin 4 o envenos un mensaje a travs de MyChart.   No podemos decirle cul ser su copago por los medicamentos por adelantado ya que esto es diferente dependiendo de la cobertura de su seguro.  Sin embargo, es posible que podamos encontrar un medicamento sustituto a menor costo o llenar un formulario para que el seguro cubra el medicamento que se considera necesario.   Si se requiere una autorizacin previa para que su compaa de seguros cubra su medicamento, por favor permtanos de 1 a 2 das hbiles para completar este proceso.  Los precios de los medicamentos varan con frecuencia dependiendo del lugar de dnde se surte la receta y alguna farmacias pueden ofrecer precios ms baratos.  El sitio web www.goodrx.com tiene cupones para medicamentos de diferentes farmacias. Los precios aqu no tienen en cuenta lo que podra costar con la ayuda del seguro (puede ser ms barato con su seguro), pero el sitio web puede darle el precio si no utiliz ningn seguro.  - Puede imprimir el cupn correspondiente y llevarlo con su receta a la farmacia.  - Tambin puede pasar por nuestra oficina durante el horario de atencin regular y recoger una tarjeta de cupones de GoodRx.  -   Si necesita que su receta se enve electrnicamente a una farmacia diferente, informe a nuestra oficina a travs de MyChart de [place] o por telfono llamando al [phone] y presione la opcin 4.  

## 2023-01-09 NOTE — Progress Notes (Signed)
   Follow-Up Visit   Subjective  Wendy Beck is a 77 y.o. female who presents for the following: AK follow up of right infraorbital and nose treated with LN2. Biopsy follow up right tricep near elbow - SCC treated with [facility] 09/07/2022. She would like a spot on her left chest checked.  Also with rash. The patient has spots, moles and lesions to be evaluated, some may be new or changing and the patient has concerns that these could be cancer.  The following portions of the chart were reviewed this encounter and updated as appropriate: medications, allergies, medical history  Review of Systems:  No other skin or systemic complaints except as noted in HPI or Assessment and Plan.  Objective  Well appearing patient in no apparent distress; mood and affect are within normal limits. A focused examination was performed of the following areas: Face, chest, arms Relevant exam findings are noted in the Assessment and Plan.   Assessment & Plan   INFLAMED SEBORRHEIC KERATOSIS Exam: Erythematous keratotic or waxy stuck-on papule or plaque.  Symptomatic, irritating, patient would like treated.  Benign-appearing.  Call clinic for new or changing lesions.   Prior to procedure, discussed risks of blister formation, small wound, skin dyspigmentation, or rare scar following treatment. Recommend Vaseline ointment to treated areas while healing.  Destruction Procedure Note Destruction method: cryotherapy   Informed consent: discussed and consent obtained   Lesion destroyed using liquid nitrogen: Yes   Outcome: patient tolerated procedure well with no complications   Post-procedure details: wound care instructions given   Locations: left chest # of Lesions Treated: 1  HISTORY OF SQUAMOUS CELL CARCINOMA OF THE SKIN - No evidence of recurrence today - No lymphadenopathy - Recommend regular full body skin exams - Recommend daily broad spectrum sunscreen SPF 30+ to sun-exposed areas, reapply every 2  hours as needed.  - Call if any new or changing lesions are noted between office visits  ATOPIC DERMATITIS Exam: Pinkness of left neck  Atopic dermatitis (eczema) is a chronic, relapsing, pruritic condition that can significantly affect quality of life. It is often associated with allergic rhinitis and/or asthma and can require treatment with topical medications, phototherapy, or in severe cases biologic injectable medication (Dupixent; Adbry) or Oral JAK inhibitors.  Treatment Plan: Mometasone cream qd prn rash of neck  Recommend gentle skin care.  ACTINIC DAMAGE - chronic, secondary to cumulative UV radiation exposure/sun exposure over time - diffuse scaly erythematous macules with underlying dyspigmentation - Recommend daily broad spectrum sunscreen SPF 30+ to sun-exposed areas, reapply every 2 hours as needed.  - Recommend staying in the shade or wearing Milnes sleeves, sun glasses (UVA+UVB protection) and wide brim hats (4-inch brim around the entire circumference of the hat). - Call for new or changing lesions.  Return in about 6 months (around 07/11/2023) for TBSE.  I, Joanie Coddington, CMA, am acting as scribe for Armida Sans, MD .  Documentation: I have reviewed the above documentation for accuracy and completeness, and I agree with the above.  Armida Sans, MD

## 2023-01-24 ENCOUNTER — Encounter: Payer: Self-pay | Admitting: Dermatology

## 2023-01-25 ENCOUNTER — Ambulatory Visit: Payer: Medicare Other | Admitting: Family Medicine

## 2023-03-22 ENCOUNTER — Other Ambulatory Visit: Payer: Self-pay | Admitting: Family Medicine

## 2023-03-22 DIAGNOSIS — G47419 Narcolepsy without cataplexy: Secondary | ICD-10-CM

## 2023-03-22 DIAGNOSIS — I10 Essential (primary) hypertension: Secondary | ICD-10-CM

## 2023-05-16 ENCOUNTER — Telehealth: Payer: Self-pay | Admitting: Family Medicine

## 2023-05-16 NOTE — Telephone Encounter (Signed)
It looks like patient has transferred to [facility]. See office notes in Epic.

## 2023-07-04 ENCOUNTER — Other Ambulatory Visit: Payer: Self-pay | Admitting: Family Medicine

## 2023-07-04 DIAGNOSIS — E785 Hyperlipidemia, unspecified: Secondary | ICD-10-CM

## 2023-07-04 DIAGNOSIS — E1159 Type 2 diabetes mellitus with other circulatory complications: Secondary | ICD-10-CM

## 2023-07-04 NOTE — Telephone Encounter (Signed)
Requested medication (s) are due for refill today: Yes  Requested medication (s) are on the active medication list: Yes  Last refill:  12/29/22  Future visit scheduled: No  Notes to clinic:  Is pt. Still seen at the practice?    Requested Prescriptions  Pending Prescriptions Disp Refills   metFORMIN (GLUCOPHAGE-XR) 500 MG 24 hr tablet [Pharmacy Med Name: METFORMIN HCL ER 500 MG TABLET] 180 tablet 1    Sig: TAKE 2 TABLETS BY MOUTH DAILY     Endocrinology:  Diabetes - Biguanides Failed - 07/04/2023  6:21 AM      Failed - HBA1C is between 0 and 7.9 and within 180 days    Hgb A1c MFr Bld  Date Value Ref Range Status  10/26/2022 6.9 (H) 4.8 - 5.6 % Final    Comment:             Prediabetes: 5.7 - 6.4          Diabetes: >6.4          Glycemic control for adults with diabetes: <7.0          Failed - eGFR in normal range and within 360 days    GFR calc Af Amer  Date Value Ref Range Status  11/17/2020 77 >59 mL/min/1.73 Final    Comment:    **In accordance with recommendations from the NKF-ASN Task force,**   [facility] is in the process of updating its eGFR calculation to the   2021 CKD-EPI creatinine equation that estimates kidney function   without a race variable.    GFR calc non Af Amer  Date Value Ref Range Status  11/17/2020 67 >59 mL/min/1.73 Final   eGFR  Date Value Ref Range Status  10/26/2022 58 (L) >59 mL/min/1.73 Final         Failed - B12 Level in normal range and within 720 days    No results found for: "VITAMINB12"       Failed - Valid encounter within last 6 months    Recent Outpatient Visits           7 months ago Acute cough   [place] [facility] [place], Marzella Schlein, MD   8 months ago Type 2 diabetes mellitus with other circulatory complication, without Demirjian-term current use of insulin (HCC)   [place] [facility] Simmons-Robinson, [place], MD   11 months ago Acute bronchitis, unspecified organism   [facility] Alfredia Ferguson, PA-C   1 year ago Encounter for [employer] annual wellness exam   [facility] Bosie Clos, MD   1 year ago Type 2 diabetes mellitus with other circulatory complication, without Monds-term current use of insulin [facility])   [place] [facility] Bosie Clos, MD       Future Appointments             In 3 weeks Deirdre Evener, MD [facility]            Failed - CBC within normal limits and completed in the last 12 months    WBC  Date Value Ref Range Status  04/26/2022 5.9 3.4 - 10.8 x10E3/uL Final  03/31/2019 10.8 (H) 4.0 - 10.5 K/uL Final   RBC  Date Value Ref Range Status  04/26/2022 4.23 3.77 - 5.28 x10E6/uL Final  03/31/2019 4.42 3.87 - 5.11 MIL/uL Final   Hemoglobin  Date Value Ref Range Status  04/26/2022 13.0  11.1 - 15.9 g/dL Final   Hematocrit  Date Value Ref Range Status  04/26/2022 38.2 34.0 - 46.6 % Final   MCHC  Date Value Ref Range Status  04/26/2022 34.0 31.5 - 35.7 g/dL Final  16/07/9603 54.0 30.0 - 36.0 g/dL Final   [facility]  Date Value Ref Range Status  04/26/2022 30.7 26.6 - 33.0 pg Final  03/31/2019 30.3 26.0 - 34.0 pg Final   MCV  Date Value Ref Range Status  04/26/2022 90 79 - 97 fL Final   No results found for: "PLTCOUNTKUC", "LABPLAT", "POCPLA" RDW  Date Value Ref Range Status  04/26/2022 12.5 11.7 - 15.4 % Final         Passed - Cr in normal range and within 360 days    Creatinine, Ser  Date Value Ref Range Status  10/26/2022 1.00 0.57 - 1.00 mg/dL Final

## 2023-07-04 NOTE — Telephone Encounter (Signed)
Requested medication (s) are due for refill today: Yes  Requested medication (s) are on the active medication list: Yes  Last refill:  12/28/22  Future visit scheduled: No  Notes to clinic:  Is pt. Still seen at the practice?    Requested Prescriptions  Pending Prescriptions Disp Refills   atorvastatin (LIPITOR) 40 MG tablet [Pharmacy Med Name: ATORVASTATIN 40 MG TABLET] 90 tablet 1    Sig: TAKE 1 TABLET BY MOUTH DAILY     Cardiovascular:  Antilipid - Statins Failed - 07/04/2023  6:21 AM      Failed - Lipid Panel in normal range within the last 12 months    Cholesterol, Total  Date Value Ref Range Status  10/26/2022 181 100 - 199 mg/dL Final   LDL Chol Calc (NIH)  Date Value Ref Range Status  10/26/2022 109 (H) 0 - 99 mg/dL Final   HDL  Date Value Ref Range Status  10/26/2022 49 >39 mg/dL Final   Triglycerides  Date Value Ref Range Status  10/26/2022 127 0 - 149 mg/dL Final         Passed - Patient is not pregnant      Passed - Valid encounter within last 12 months    Recent Outpatient Visits           7 months ago Acute cough   [place] [facility] [place], Marzella Schlein, MD   8 months ago Type 2 diabetes mellitus with other circulatory complication, without Meulemans-term current use of insulin (HCC)   [place] [facility] Simmons-Robinson, [place], MD   11 months ago Acute bronchitis, unspecified organism   [facility] Alfredia Ferguson, PA-C   1 year ago Encounter for [employer] annual wellness exam   [facility] Bosie Clos, MD   1 year ago Type 2 diabetes mellitus with other circulatory complication, without Parsell-term current use of insulin [facility])   [place] [facility] Bosie Clos, MD       Future Appointments             In 3 weeks Deirdre Evener, MD [facility]

## 2023-07-26 ENCOUNTER — Encounter: Payer: Self-pay | Admitting: Dermatology

## 2023-07-26 ENCOUNTER — Ambulatory Visit: Payer: Medicare Other | Admitting: Dermatology

## 2023-07-26 DIAGNOSIS — D1801 Hemangioma of skin and subcutaneous tissue: Secondary | ICD-10-CM | POA: Diagnosis not present

## 2023-07-26 DIAGNOSIS — L814 Other melanin hyperpigmentation: Secondary | ICD-10-CM | POA: Diagnosis not present

## 2023-07-26 DIAGNOSIS — Z1283 Encounter for screening for malignant neoplasm of skin: Secondary | ICD-10-CM | POA: Diagnosis not present

## 2023-07-26 DIAGNOSIS — L578 Other skin changes due to chronic exposure to nonionizing radiation: Secondary | ICD-10-CM | POA: Diagnosis not present

## 2023-07-26 DIAGNOSIS — Z7189 Other specified counseling: Secondary | ICD-10-CM

## 2023-07-26 DIAGNOSIS — L820 Inflamed seborrheic keratosis: Secondary | ICD-10-CM

## 2023-07-26 DIAGNOSIS — D229 Melanocytic nevi, unspecified: Secondary | ICD-10-CM

## 2023-07-26 DIAGNOSIS — W908XXA Exposure to other nonionizing radiation, initial encounter: Secondary | ICD-10-CM | POA: Diagnosis not present

## 2023-07-26 DIAGNOSIS — L821 Other seborrheic keratosis: Secondary | ICD-10-CM

## 2023-07-26 DIAGNOSIS — L603 Nail dystrophy: Secondary | ICD-10-CM

## 2023-07-26 DIAGNOSIS — L570 Actinic keratosis: Secondary | ICD-10-CM | POA: Diagnosis not present

## 2023-07-26 DIAGNOSIS — Z85828 Personal history of other malignant neoplasm of skin: Secondary | ICD-10-CM | POA: Diagnosis not present

## 2023-07-26 DIAGNOSIS — Z8589 Personal history of malignant neoplasm of other organs and systems: Secondary | ICD-10-CM

## 2023-07-26 NOTE — Patient Instructions (Addendum)

## 2023-07-26 NOTE — Progress Notes (Signed)
Follow-Up Visit   Subjective  Wendy Beck is a 77 y.o. female who presents for the following: Skin Cancer Screening and Full Body Skin Exam  The patient presents for Total-Body Skin Exam (TBSE) for skin cancer screening and mole check. The patient has spots, moles and lesions to be evaluated, some may be new or changing and the patient may have concern these could be cancer.  Patient with hx of SCC, BCC. Patient has some irritated spots that get scaly and won't heal at left chest, spots at right arm close to where her [facility] was that she is concerned about. Patient presents with a list of spots to check at right side, under right breast.   The following portions of the chart were reviewed this encounter and updated as appropriate: medications, allergies, medical history  Review of Systems:  No other skin or systemic complaints except as noted in HPI or Assessment and Plan.  Objective  Well appearing patient in no apparent distress; mood and affect are within normal limits.  A full examination was performed including scalp, head, eyes, ears, nose, lips, neck, chest, axillae, abdomen, back, buttocks, bilateral upper extremities, bilateral lower extremities, hands, feet, fingers, toes, fingernails, and toenails. All findings within normal limits unless otherwise noted below.   Relevant physical exam findings are noted in the Assessment and Plan.  R forehead x 1 Erythematous thin papules/macules with gritty scale.   R back x 1, R neck x 2, L chest x 3, R side x 1, R inframammary x 1 (8) Erythematous stuck-on, waxy papule or plaque    Assessment & Plan   SKIN CANCER SCREENING PERFORMED TODAY.  ACTINIC DAMAGE - Chronic condition, secondary to cumulative UV/sun exposure - diffuse scaly erythematous macules with underlying dyspigmentation - Recommend daily broad spectrum sunscreen SPF 30+ to sun-exposed areas, reapply every 2 hours as needed.  - Staying in the shade or wearing Tomaselli  sleeves, sun glasses (UVA+UVB protection) and wide brim hats (4-inch brim around the entire circumference of the hat) are also recommended for sun protection.  - Call for new or changing lesions.  LENTIGINES, SEBORRHEIC KERATOSES, HEMANGIOMAS - Benign normal skin lesions - Benign-appearing - Call for any changes  MELANOCYTIC NEVI - Tan-brown and/or pink-flesh-colored symmetric macules and papules - Benign appearing on exam today - Observation - Call clinic for new or changing moles - Recommend daily use of broad spectrum spf 30+ sunscreen to sun-exposed areas.   HISTORY OF BASAL CELL CARCINOMA OF THE SKIN - No evidence of recurrence today - Recommend regular full body skin exams - Recommend daily broad spectrum sunscreen SPF 30+ to sun-exposed areas, reapply every 2 hours as needed.  - Call if any new or changing lesions are noted between office visits  HISTORY OF SQUAMOUS CELL CARCINOMA OF THE SKIN - No evidence of recurrence today - No lymphadenopathy - Recommend regular full body skin exams - Recommend daily broad spectrum sunscreen SPF 30+ to sun-exposed areas, reapply every 2 hours as needed.  - Call if any new or changing lesions are noted between office visits       AK (actinic keratosis) R forehead x 1  Actinic keratoses are precancerous spots that appear secondary to cumulative UV radiation exposure/sun exposure over time. They are chronic with expected duration over 1 year. A portion of actinic keratoses will progress to squamous cell carcinoma of the skin. It is not possible to reliably predict which spots will progress to skin cancer and so treatment  is recommended to prevent development of skin cancer.  Recommend daily broad spectrum sunscreen SPF 30+ to sun-exposed areas, reapply every 2 hours as needed.  Recommend staying in the shade or wearing Calabrese sleeves, sun glasses (UVA+UVB protection) and wide brim hats (4-inch brim around the entire circumference of the  hat). Call for new or changing lesions.   Destruction of lesion - R forehead x 1 Complexity: simple   Destruction method: cryotherapy   Informed consent: discussed and consent obtained   Timeout:  patient name, date of birth, surgical site, and procedure verified Lesion destroyed using liquid nitrogen: Yes   Region frozen until ice ball extended beyond lesion: Yes   Outcome: patient tolerated procedure well with no complications   Post-procedure details: wound care instructions given    Inflamed seborrheic keratosis (8) R back x 1, R neck x 2, L chest x 3, R side x 1, R inframammary x 1  Symptomatic, irritating, patient would like treated.  Benign-appearing.  Call clinic for new or changing lesions.    Destruction of lesion - R back x 1, R neck x 2, L chest x 3, R side x 1, R inframammary x 1 (8) Complexity: simple   Destruction method: cryotherapy   Informed consent: discussed and consent obtained   Timeout:  patient name, date of birth, surgical site, and procedure verified Lesion destroyed using liquid nitrogen: Yes   Region frozen until ice ball extended beyond lesion: Yes   Outcome: patient tolerated procedure well with no complications   Post-procedure details: wound care instructions given     Return in about 1 year (around 07/25/2024) for TBSE, with Dr. Kirtland Bouchard, Hx BCC, Hx SCC.  Anise Salvo, RMA, am acting as scribe for Armida Sans, MD .   Documentation: I have reviewed the above documentation for accuracy and completeness, and I agree with the above.  Armida Sans, MD

## 2023-08-10 ENCOUNTER — Other Ambulatory Visit: Payer: Self-pay | Admitting: Family Medicine

## 2023-08-10 DIAGNOSIS — E785 Hyperlipidemia, unspecified: Secondary | ICD-10-CM

## 2023-08-13 NOTE — Telephone Encounter (Signed)
Requested by interface surescripts.  Requested Prescriptions  Pending Prescriptions Disp Refills   atorvastatin (LIPITOR) 40 MG tablet [Pharmacy Med Name: ATORVASTATIN 40 MG TABLET] 90 tablet 1    Sig: TAKE 1 TABLET BY MOUTH DAILY     Cardiovascular:  Antilipid - Statins Failed - 08/13/2023  9:27 AM      Failed - Lipid Panel in normal range within the last 12 months    Cholesterol, Total  Date Value Ref Range Status  10/26/2022 181 100 - 199 mg/dL Final   LDL Chol Calc (NIH)  Date Value Ref Range Status  10/26/2022 109 (H) 0 - 99 mg/dL Final   HDL  Date Value Ref Range Status  10/26/2022 49 >39 mg/dL Final   Triglycerides  Date Value Ref Range Status  10/26/2022 127 0 - 149 mg/dL Final         Passed - Patient is not pregnant      Passed - Valid encounter within last 12 months    Recent Outpatient Visits           8 months ago Acute cough   [place] [facility] [place], Marzella Schlein, MD   9 months ago Type 2 diabetes mellitus with other circulatory complication, without Barnhardt-term current use of insulin (HCC)   [place] [facility] Simmons-Robinson, [place], MD   1 year ago Acute bronchitis, unspecified organism   [facility] Alfredia Ferguson, PA-C   1 year ago Encounter for [employer] annual wellness exam   [facility] Bosie Clos, MD   1 year ago Type 2 diabetes mellitus with other circulatory complication, without Free-term current use of insulin [facility])   [place] [facility] Bosie Clos, MD       Future Appointments             In 11 months Deirdre Evener, MD [facility]

## 2023-10-09 ENCOUNTER — Other Ambulatory Visit: Payer: Self-pay | Admitting: Obstetrics & Gynecology

## 2023-10-09 DIAGNOSIS — N644 Mastodynia: Secondary | ICD-10-CM

## 2023-10-15 LAB — HM DIABETES EYE EXAM

## 2023-10-16 ENCOUNTER — Other Ambulatory Visit: Payer: Self-pay | Admitting: *Deleted

## 2023-10-16 ENCOUNTER — Inpatient Hospital Stay
Admission: RE | Admit: 2023-10-16 | Discharge: 2023-10-16 | Disposition: A | Discharge status: Home / Self Care | Payer: Self-pay | Source: Ambulatory Visit | Attending: Family Medicine | Admitting: Family Medicine

## 2023-10-16 DIAGNOSIS — Z1231 Encounter for screening mammogram for malignant neoplasm of breast: Secondary | ICD-10-CM

## 2023-11-23 ENCOUNTER — Ambulatory Visit
Admission: RE | Admit: 2023-11-23 | Discharge: 2023-11-23 | Disposition: A | Discharge status: Home / Self Care | Payer: Medicare Other | Source: Ambulatory Visit | Attending: Obstetrics & Gynecology | Admitting: Obstetrics & Gynecology

## 2023-11-23 ENCOUNTER — Other Ambulatory Visit: Payer: Self-pay | Admitting: Obstetrics & Gynecology

## 2023-11-23 DIAGNOSIS — N644 Mastodynia: Secondary | ICD-10-CM

## 2023-12-03 ENCOUNTER — Encounter: Payer: Self-pay | Admitting: Family Medicine

## 2023-12-11 ENCOUNTER — Encounter: Payer: Self-pay | Admitting: Gastroenterology

## 2023-12-17 ENCOUNTER — Ambulatory Visit: Admission: RE | Admit: 2023-12-17 | Payer: Medicare Other | Source: Home / Self Care | Admitting: Gastroenterology

## 2023-12-17 SURGERY — COLONOSCOPY WITH PROPOFOL
Anesthesia: General

## 2024-02-12 ENCOUNTER — Other Ambulatory Visit: Payer: Self-pay | Admitting: Family Medicine

## 2024-02-12 DIAGNOSIS — E785 Hyperlipidemia, unspecified: Secondary | ICD-10-CM

## 2024-07-30 ENCOUNTER — Ambulatory Visit: Payer: Medicare Other | Admitting: Dermatology

## 2024-07-30 DIAGNOSIS — Z85828 Personal history of other malignant neoplasm of skin: Secondary | ICD-10-CM

## 2024-07-30 DIAGNOSIS — L821 Other seborrheic keratosis: Secondary | ICD-10-CM

## 2024-07-30 DIAGNOSIS — D489 Neoplasm of uncertain behavior, unspecified: Secondary | ICD-10-CM

## 2024-07-30 DIAGNOSIS — Z1283 Encounter for screening for malignant neoplasm of skin: Secondary | ICD-10-CM | POA: Diagnosis not present

## 2024-07-30 DIAGNOSIS — L814 Other melanin hyperpigmentation: Secondary | ICD-10-CM | POA: Diagnosis not present

## 2024-07-30 DIAGNOSIS — L719 Rosacea, unspecified: Secondary | ICD-10-CM

## 2024-07-30 DIAGNOSIS — D492 Neoplasm of unspecified behavior of bone, soft tissue, and skin: Secondary | ICD-10-CM | POA: Diagnosis not present

## 2024-07-30 DIAGNOSIS — Z79899 Other long term (current) drug therapy: Secondary | ICD-10-CM

## 2024-07-30 DIAGNOSIS — L820 Inflamed seborrheic keratosis: Secondary | ICD-10-CM | POA: Diagnosis not present

## 2024-07-30 DIAGNOSIS — L650 Telogen effluvium: Secondary | ICD-10-CM | POA: Diagnosis not present

## 2024-07-30 DIAGNOSIS — D171 Benign lipomatous neoplasm of skin and subcutaneous tissue of trunk: Secondary | ICD-10-CM

## 2024-07-30 DIAGNOSIS — Z8589 Personal history of malignant neoplasm of other organs and systems: Secondary | ICD-10-CM

## 2024-07-30 DIAGNOSIS — W908XXA Exposure to other nonionizing radiation, initial encounter: Secondary | ICD-10-CM

## 2024-07-30 DIAGNOSIS — Z7189 Other specified counseling: Secondary | ICD-10-CM

## 2024-07-30 DIAGNOSIS — L578 Other skin changes due to chronic exposure to nonionizing radiation: Secondary | ICD-10-CM

## 2024-07-30 DIAGNOSIS — D229 Melanocytic nevi, unspecified: Secondary | ICD-10-CM

## 2024-07-30 DIAGNOSIS — D2272 Melanocytic nevi of left lower limb, including hip: Secondary | ICD-10-CM | POA: Diagnosis not present

## 2024-07-30 DIAGNOSIS — L570 Actinic keratosis: Secondary | ICD-10-CM | POA: Diagnosis not present

## 2024-07-30 NOTE — Progress Notes (Signed)
 Follow-Up Visit   Subjective  Wendy Beck is a 78 y.o. female who presents for the following: Skin Cancer Screening and Full Body Skin Exam Hx scc, hx of bcc Hx of aks and isks , hx of rosacea   Patient reports some spots at face she would like checked. Also reports some hair loss in last 8 months, reports some weight loss due to stomach problems    The patient presents for Total-Body Skin Exam (TBSE) for skin cancer screening and mole check. The patient has spots, moles and lesions to be evaluated, some may be new or changing and the patient may have concern these could be cancer.  Left anterior thigh  0.6 cm brown macule  R/o dysplasia vs other  The following portions of the chart were reviewed this encounter and updated as appropriate: medications, allergies, medical history  Review of Systems:  No other skin or systemic complaints except as noted in HPI or Assessment and Plan.  Objective  Well appearing patient in no apparent distress; mood and affect are within normal limits.  A full examination was performed including scalp, head, eyes, ears, nose, lips, neck, chest, axillae, abdomen, back, buttocks, bilateral upper extremities, bilateral lower extremities, hands, feet, fingers, toes, fingernails, and toenails. All findings within normal limits unless otherwise noted below.   Relevant physical exam findings are noted in the Assessment and Plan.  above right lateral brow x 1 Erythematous thin papules/macules with gritty scale.  Left Temple x 1, right tricep x 6 (7) Erythematous stuck-on, waxy papule or plaque Left Thigh - Anterior 0.6 cm brown macule    Assessment & Plan   HISTORY OF SQUAMOUS CELL CARCINOMA OF THE SKIN 09/07/2022 right tricep near elbow - treated with ED&C - No evidence of recurrence today - No lymphadenopathy - Recommend regular full body skin exams - Recommend daily broad spectrum sunscreen SPF 30+ to sun-exposed areas, reapply every 2 hours as  needed.  - Call if any new or changing lesions are noted between office visits  HISTORY OF BASAL CELL CARCINOMA OF THE SKIN 2014 Forehead  - No evidence of recurrence today - Recommend regular full body skin exams - Recommend daily broad spectrum sunscreen SPF 30+ to sun-exposed areas, reapply every 2 hours as needed.  - Call if any new or changing lesions are noted between office visits  SKIN CANCER SCREENING PERFORMED TODAY.  ACTINIC DAMAGE - Chronic condition, secondary to cumulative UV/sun exposure - diffuse scaly erythematous macules with underlying dyspigmentation - Recommend daily broad spectrum sunscreen SPF 30+ to sun-exposed areas, reapply every 2 hours as needed.  - Staying in the shade or wearing [place] sleeves, sun glasses (UVA+UVB protection) and wide brim hats (4-inch brim around the entire circumference of the hat) are also recommended for sun protection.  - Call for new or changing lesions.  LENTIGINES, SEBORRHEIC KERATOSES, HEMANGIOMAS - Benign normal skin lesions - Benign-appearing - Call for any changes - Sks at face   MELANOCYTIC NEVI - Tan-brown and/or pink-flesh-colored symmetric macules and papules - Benign appearing on exam today - Observation - Call clinic for new or changing moles - Recommend daily use of broad spectrum spf 30+ sunscreen to sun-exposed areas.   Rosacea Face  Mid face erythema with telangiectasias   Chronic and persistent condition with duration or expected duration over one year. Condition is bothersome/symptomatic for patient. Currently flared.  Rosacea is a chronic progressive skin condition usually affecting the face of adults, causing redness and/or acne bumps.  It is treatable but not curable. It sometimes affects the eyes (ocular rosacea) as well. It may respond to topical and/or systemic medication and can flare with stress, sun exposure, alcohol, exercise, topical steroids (including hydrocortisone /cortisone 10) and some foods.   Daily application of broad spectrum spf 30+ sunscreen to face is recommended to reduce flares. Chronic and persistent condition with duration or expected duration over one year. Condition is bothersome/symptomatic for patient. Currently flared.    Continue [facility] metronidazole/ivermectin/azelaic acid twice daily as needed to affected areas on the face. The patient was advised this is not covered by insurance since it is made by a compounding pharmacy. They will receive an email to check out and the medication will be mailed to their home. Refills sent    TELOGEN EFFLUVIUM associated with 10 pound weight loss and stomach sickness  Started 8 months ago Exam: Diffuse thinning of hair, positive hair pull test.  Telogen effluvium is a benign, self-limited condition causing increased hair shedding usually for several months. It does not progress to baldness, and the hair eventually grows back on its own. It can be triggered by recent illness, recent surgery, thyroid disease, low iron stores, vitamin D deficiency, fad diets or rapid weight loss, hormonal changes such as pregnancy or birth control pills, and some medication. Usually the hair loss starts 2-3 months after the illness or health change. Rarely, it can continue for longer than a year. Treatments options may include oral or topical Minoxidil; Red Light scalp treatments; Biotin 2.5 mg daily and other options.  Treatment Plan: Recommend OTC biotin supplement 2.5 -5 mg daily to help strengthen  hair.  Biotin does not promote hair growth except in the setting of underlying biotin deficiency.  Sometimes biotin can cause false results in thyroid function tests, so let your doctor know if you are taking it on a regular basis.  Reviewed prior TSH from 06/01/2023 - Normal   Pending TSH lab can discuss other treatment options in future such as minoxidil   Lipoma  Exam: Subcutaneous rubbery nodule(s) on right side  Location: right flank With  family history in grandfather and grandson  Benign-appearing. Exam most consistent with an Lipoma. Discussed that a Lipoma is a benign fatty growth that can grow over time and sometimes become painful or otherwise symptomatic. Some patients may have one or several lipomas.. Benign Hereditary Lipomatosis is a hereditary familial condition where family members tend to grow multiple lipomas.  Recommend observation if it is not changing, growing or symptomatic. Recommend surgical excision to remove it if it is painful, growing, symptomatic, or other changes noted. Please contact our office for new or changing lesions so they can be evaluated.   ACTINIC KERATOSIS above right lateral brow x 1 Actinic keratoses are precancerous spots that appear secondary to cumulative UV radiation exposure/sun exposure over time. They are chronic with expected duration over 1 year. A portion of actinic keratoses will progress to squamous cell carcinoma of the skin. It is not possible to reliably predict which spots will progress to skin cancer and so treatment is recommended to prevent development of skin cancer.  Recommend daily broad spectrum sunscreen SPF 30+ to sun-exposed areas, reapply every 2 hours as needed.  Recommend staying in the shade or wearing [place] sleeves, sun glasses (UVA+UVB protection) and wide brim hats (4-inch brim around the entire circumference of the hat). Call for new or changing lesions. Destruction of lesion - above right lateral brow x 1 Complexity: simple  Destruction method: cryotherapy   Informed consent: discussed and consent obtained   Timeout:  patient name, date of birth, surgical site, and procedure verified Lesion destroyed using liquid nitrogen: Yes   Region frozen until ice ball extended beyond lesion: Yes   Outcome: patient tolerated procedure well with no complications   Post-procedure details: wound care instructions given    INFLAMED SEBORRHEIC KERATOSIS (7) Left Temple x  1, right tricep x 6 (7) Symptomatic, irritating, patient would like treated. Destruction of lesion - Left Temple x 1, right tricep x 6 (7) Complexity: simple   Destruction method: cryotherapy   Informed consent: discussed and consent obtained   Timeout:  patient name, date of birth, surgical site, and procedure verified Lesion destroyed using liquid nitrogen: Yes   Region frozen until ice ball extended beyond lesion: Yes   Outcome: patient tolerated procedure well with no complications   Post-procedure details: wound care instructions given    TELOGEN EFFLUVIUM   Related Procedures TSH NEOPLASM OF UNCERTAIN BEHAVIOR Left Thigh - Anterior Epidermal / dermal shaving  Lesion diameter (cm):  0.6 Informed consent: discussed and consent obtained   Timeout: patient name, date of birth, surgical site, and procedure verified   Procedure prep:  Patient was prepped and draped in usual sterile fashion Prep type:  Isopropyl alcohol Anesthesia: the lesion was anesthetized in a standard fashion   Anesthetic:  1% lidocaine  w/ epinephrine  1-100,000 buffered w/ 8.4% NaHCO3 Instrument used: flexible razor blade   Hemostasis achieved with: pressure, aluminum chloride and electrodesiccation   Outcome: patient tolerated procedure well   Post-procedure details: sterile dressing applied and wound care instructions given   Dressing type: bandage and petrolatum    Specimen 1 - Surgical pathology Differential Diagnosis: r/o dsyplasia vs other   Check Margins: yes Return in about 1 year (around 07/30/2025) for TBSE.  IEleanor Blush, CMA, am acting as scribe for Alm Rhyme, MD.   Documentation: I have reviewed the above documentation for accuracy and completeness, and I agree with the above.  Alm Rhyme, MD

## 2024-07-30 NOTE — Patient Instructions (Addendum)
 Biopsy Wound Care Instructions  Leave the original bandage on for 24 hours if possible.  If the bandage becomes soaked or soiled before that time, it is OK to remove it and examine the wound.  A small amount of post-operative bleeding is normal.  If excessive bleeding occurs, remove the bandage, place gauze over the site and apply continuous pressure (no peeking) over the area for 30 minutes. If this does not work, please call our clinic as soon as possible or page your doctor if it is after hours.   Once a day, cleanse the wound with soap and water. It is fine to shower. If a thick crust develops you may use a Q-tip dipped into dilute hydrogen peroxide (mix 1:1 with water) to dissolve it.  Hydrogen peroxide can slow the healing process, so use it only as needed.    After washing, apply petroleum jelly (Vaseline) or an antibiotic ointment if your doctor prescribed one for you, followed by a bandage.    For best healing, the wound should be covered with a layer of ointment at all times. If you are not able to keep the area covered with a bandage to hold the ointment in place, this may mean re-applying the ointment several times a day.  Continue this wound care until the wound has healed and is no longer open.   Itching and mild discomfort is normal during the healing process. However, if you develop pain or severe itching, please call our office.   If you have any discomfort, you can take Tylenol (acetaminophen) or ibuprofen as directed on the bottle. (Please do not take these if you have an allergy to them or cannot take them for another reason).  Some redness, tenderness and white or yellow material in the wound is normal healing.  If the area becomes very sore and red, or develops a thick yellow-green material (pus), it may be infected; please notify us .    If you have stitches, return to clinic as directed to have the stitches removed. You will continue wound care for 2-3 days after the stitches  are removed.   Wound healing continues for up to one year following surgery. It is not unusual to experience pain in the scar from time to time during the interval.  If the pain becomes severe or the scar thickens, you should notify the office.    A slight amount of redness in a scar is expected for the first six months.  After six months, the redness will fade and the scar will soften and fade.  The color difference becomes less noticeable with time.  If there are any problems, return for a post-op surgery check at your earliest convenience.  To improve the appearance of the scar, you can use silicone scar gel, cream, or sheets (such as Mederma or Serica) every night for up to one year. These are available over the counter (without a prescription).  Please call our office at [phone] for any questions or concerns.      Cryotherapy Aftercare  Wash gently with soap and water everyday.   Apply Vaseline and Band-Aid daily until healed.   Seborrheic Keratosis  What causes seborrheic keratoses? Seborrheic keratoses are harmless, common skin growths that first appear during adult life.  As time goes by, more growths appear.  Some people may develop a large number of them.  Seborrheic keratoses appear on both covered and uncovered body parts.  They are not caused by sunlight.  The tendency  to develop seborrheic keratoses can be inherited.  They vary in color from skin-colored to gray, brown, or even black.  They can be either smooth or have a rough, warty surface.   Seborrheic keratoses are superficial and look as if they were stuck on the skin.  Under the microscope this type of keratosis looks like layers upon layers of skin.  That is why at times the top layer may seem to fall off, but the rest of the growth remains and re-grows.    Treatment Seborrheic keratoses do not need to be treated, but can easily be removed in the office.  Seborrheic keratoses often cause symptoms when they rub on  clothing or jewelry.  Lesions can be in the way of shaving.  If they become inflamed, they can cause itching, soreness, or burning.  Removal of a seborrheic keratosis can be accomplished by freezing, burning, or surgery. If any spot bleeds, scabs, or grows rapidly, please return to have it checked, as these can be an indication of a skin cancer.  Actinic keratoses are precancerous spots that appear secondary to cumulative UV radiation exposure/sun exposure over time. They are chronic with expected duration over 1 year. A portion of actinic keratoses will progress to squamous cell carcinoma of the skin. It is not possible to reliably predict which spots will progress to skin cancer and so treatment is recommended to prevent development of skin cancer.  Recommend daily broad spectrum sunscreen SPF 30+ to sun-exposed areas, reapply every 2 hours as needed.  Recommend staying in the shade or wearing long sleeves, sun glasses (UVA+UVB protection) and wide brim hats (4-inch brim around the entire circumference of the hat). Call for new or changing lesions.       Melanoma ABCDEs  Melanoma is the most dangerous type of skin cancer, and is the leading cause of death from skin disease.  You are more likely to develop melanoma if you: Have light-colored skin, light-colored eyes, or red or blond hair Spend a lot of time in the sun Tan regularly, either outdoors or in a tanning bed Have had blistering sunburns, especially during childhood Have a close family member who has had a melanoma Have atypical moles or large birthmarks  Early detection of melanoma is key since treatment is typically straightforward and cure rates are extremely high if we catch it early.   The first sign of melanoma is often a change in a mole or a new dark spot.  The ABCDE system is a way of remembering the signs of melanoma.  A for asymmetry:  The two halves do not match. B for border:  The edges of the growth are  irregular. C for color:  A mixture of colors are present instead of an even brown color. D for diameter:  Melanomas are usually (but not always) greater than 6mm - the size of a pencil eraser. E for evolution:  The spot keeps changing in size, shape, and color.  Please check your skin once per month between visits. You can use a small mirror in front and a large mirror behind you to keep an eye on the back side or your body.   If you see any new or changing lesions before your next follow-up, please call to schedule a visit.  Please continue daily skin protection including broad spectrum sunscreen SPF 30+ to sun-exposed areas, reapplying every 2 hours as needed when you're outdoors.   Staying in the shade or wearing long sleeves, sun glasses (  UVA+UVB protection) and wide brim hats (4-inch brim around the entire circumference of the hat) are also recommended for sun protection.    Due to recent changes in healthcare laws, you may see results of your pathology and/or laboratory studies on MyChart before the doctors have had a chance to review them. We understand that in some cases there may be results that are confusing or concerning to you. Please understand that not all results are received at the same time and often the doctors may need to interpret multiple results in order to provide you with the best plan of care or course of treatment. Therefore, we ask that you please give us  2 business days to thoroughly review all your results before contacting the office for clarification. Should we see a critical lab result, you will be contacted sooner.   If You Need Anything After Your Visit  If you have any questions or concerns for your doctor, please call our main line at [phone] and press option 4 to reach your doctor's medical assistant. If no one answers, please leave a voicemail as directed and we will return your call as soon as possible. Messages left after 4 pm will be answered the  following business day.   You may also send us  a message via MyChart. We typically respond to MyChart messages within 1-2 business days.  For prescription refills, please ask your pharmacy to contact our office. Our fax number is [phone].  If you have an urgent issue when the clinic is closed that cannot wait until the next business day, you can page your doctor at the number below.    Please note that while we do our best to be available for urgent issues outside of office hours, we are not available 24/7.   If you have an urgent issue and are unable to reach us , you may choose to seek medical care at your doctor's office, retail clinic, urgent care center, or emergency room.  If you have a medical emergency, please immediately call 911 or go to the emergency department.  Pager Numbers  - Dr. Hester: [phone]  - Dr. Jackquline: [phone]  - Dr. Claudene: [phone]   - Dr. Raymund: [phone]  In the event of inclement weather, please call our main line at [phone] for an update on the status of any delays or closures.  Dermatology Medication Tips: Please keep the boxes that topical medications come in in order to help keep track of the instructions about where and how to use these. Pharmacies typically print the medication instructions only on the boxes and not directly on the medication tubes.   If your medication is too expensive, please contact our office at [phone] option 4 or send us  a message through MyChart.   We are unable to tell what your co-pay for medications will be in advance as this is different depending on your insurance coverage. However, we may be able to find a substitute medication at lower cost or fill out paperwork to get insurance to cover a needed medication.   If a prior authorization is required to get your medication covered by your insurance company, please allow us  1-2 business days to complete this process.  Drug prices often vary  depending on where the prescription is filled and some pharmacies may offer cheaper prices.  The website www.goodrx.com contains coupons for medications through different pharmacies. The prices here do not account for what the cost may be with help from insurance (it may be  cheaper with your insurance), but the website can give you the price if you did not use any insurance.  - You can print the associated coupon and take it with your prescription to the pharmacy.  - You may also stop by our office during regular business hours and pick up a GoodRx coupon card.  - If you need your prescription sent electronically to a different pharmacy, notify our office through [facility] or by phone at [phone] option 4.     Si Usted Necesita Algo Despus de Su Visita  Tambin puede enviarnos un mensaje a travs de Clinical cytogeneticist. Por lo general respondemos a los mensajes de MyChart en el transcurso de 1 a 2 das hbiles.  Para renovar recetas, por favor pida a su farmacia que se ponga en contacto con nuestra oficina. Randi lakes de fax es [place] [phone].  Si tiene un asunto urgente cuando la clnica est cerrada y que no puede esperar hasta el siguiente da hbil, puede llamar/localizar a su doctor(a) al nmero que aparece a continuacin.   Por favor, tenga en cuenta que aunque hacemos todo lo posible para estar disponibles para asuntos urgentes fuera del horario de [place], no estamos disponibles las 24 horas del da, los 7 [address]  [address] de la [place].   Si tiene un problema urgente y no puede comunicarse con nosotros, puede optar por buscar atencin mdica  en el consultorio de su doctor(a), en una clnica privada, en un centro de atencin urgente o en una sala de emergencias.  Si tiene Engineer, drilling, por favor llame inmediatamente al 911 o vaya a la sala de emergencias.  Nmeros de bper  - Dr. Hester: [phone]  - Dra. Jackquline: [phone]  - Dr. Claudene: [phone]  - Dra.  Kitts: [phone]  En caso de inclemencias del [place], por favor llame a nuestra lnea principal al [phone] para una actualizacin sobre el estado de cualquier retraso o cierre.  Consejos para la medicacin en dermatologa: Por favor, guarde las cajas en las que vienen los medicamentos de uso tpico para ayudarle a seguir las instrucciones sobre dnde y cmo usarlos. Las farmacias generalmente imprimen las instrucciones del medicamento slo en las cajas y no directamente en los tubos del [place].   Si su medicamento es muy caro, por favor, pngase en contacto con landry rieger llamando al [phone] y presione la opcin 4 o envenos un mensaje a travs de Clinical cytogeneticist.   No podemos decirle cul ser su copago por los medicamentos por adelantado ya que esto es diferente dependiendo de la cobertura de su seguro. Sin embargo, es posible que podamos encontrar un medicamento sustituto a Audiological scientist un formulario para que el seguro cubra el medicamento que se considera necesario.   Si se requiere una autorizacin previa para que su compaa de seguros malta su medicamento, por favor permtanos de 1 a 2 das hbiles para completar este proceso.  Los precios de los medicamentos varan con frecuencia dependiendo del Environmental consultant de dnde se surte la receta y alguna farmacias pueden ofrecer precios ms baratos.  El sitio web www.goodrx.com tiene cupones para medicamentos de Health and safety inspector. Los precios aqu no tienen en cuenta lo que podra costar con la ayuda del seguro (puede ser ms barato con su seguro), pero el sitio web puede darle el precio si no utiliz Tourist information centre manager.  - Puede imprimir el cupn correspondiente y llevarlo con su receta a la farmacia.  - Tambin puede pasar por nuestra oficina durante el horario de  atencin regular y Education officer, museum una tarjeta de cupones de GoodRx.  - Si necesita que su receta se enve electrnicamente a una farmacia diferente, informe a nuestra oficina a  travs de MyChart de  o por telfono llamando al [phone] y presione la opcin 4.

## 2024-07-31 ENCOUNTER — Ambulatory Visit: Payer: Self-pay | Admitting: Dermatology

## 2024-07-31 DIAGNOSIS — L650 Telogen effluvium: Secondary | ICD-10-CM

## 2024-07-31 LAB — TSH: TSH: 1.26 u[IU]/mL (ref 0.450–4.500)

## 2024-07-31 MED ORDER — MINOXIDIL 2.5 MG PO TABS: ORAL_TABLET | ORAL | 1 refills | Status: AC

## 2024-07-31 NOTE — Telephone Encounter (Addendum)
   Sent medication to patient's prefer pharmacy [facility].  Tried calling patient back regarding need for 6 month appointment scheduled. No answer. Lm for patient to return call.   ----- Message from Alm Rhyme sent at 07/31/2024  3:22 PM EDT ----- May send Minoxidil 2.5 mg Take a  HALF Pill daily for hair loss.  Disp #45 with 1 rf. If pt is not scheduled in next 6 mos, make her a 6 mos follow up appt ----- Message ----- From: Tanda Eleanor LABOR, CMA Sent: 07/31/2024   3:00 PM EDT To: Alm JAYSON Rhyme, MD  ----- Message from Eleanor LABOR Tanda, CMA sent at 07/31/2024  3:00 PM EDT -----   ----- Message ----- From: Rhyme Alm JAYSON, MD Sent: 07/31/2024  10:34 AM EDT To: [facility] Clinical  Thyroid test = TSH from 07/30/2024 was NORMAL.  Your hair loss does not appear to be related to any Thyroid imbalance. It is likely related to your stomach issues and weight loss. This may recover when all stomach issues settle down and weight stabilizes, but can take up to a year. ----- Message ----- From: Interface, [facility] Lab Results In Sent: 07/31/2024   7:36 AM EDT To: Alm JAYSON Rhyme, MD

## 2024-07-31 NOTE — Telephone Encounter (Addendum)
 Called and discussed lab results and recommendations with patient.  She is interested in starting minoxidil.   Went over less common side effects  Doses of oral minoxidil for hair loss are considered 'low dose'. This is because the doses used for hair loss are much lower than the doses which are used for conditions such as high blood pressure (hypertension). The doses used for hypertension are 10-40mg  per day.  Side effects are uncommon at the low doses (up to 2.5 mg/day) used to treat hair loss. Potential side effects, more commonly seen at higher doses, include: Increase in hair growth (hypertrichosis) elsewhere on face and body Temporary hair shedding upon starting medication which may last up to 4 weeks Ankle swelling, fluid retention, rapid weight gain more than 5 pounds Low blood pressure and feeling lightheaded or dizzy when standing up quickly Fast or irregular heartbeat Headaches  Patient was also instructed to consult with her pharmacy if she had any questions about interactions with other medications she is currently taking.   Patient would like 3 month supply of medication.    ----- Message from Alm Rhyme sent at 07/31/2024 10:34 AM EDT ----- Thyroid test = TSH from 07/30/2024 was NORMAL.  Your hair loss does not appear to be related to any Thyroid imbalance. It is likely related to your stomach issues and weight loss. This may recover when all stomach issues settle down and weight stabilizes, but can take up to a year. ----- Message ----- From: Interface, [facility] Lab Results In Sent: 07/31/2024   7:36 AM EDT To: Alm JAYSON Rhyme, MD

## 2024-08-01 LAB — SURGICAL PATHOLOGY

## 2024-08-04 NOTE — Telephone Encounter (Addendum)
 Called patient and made 6 month followup to recheck hair loss.  ----- Message from Wendy Beck sent at 07/31/2024 10:34 AM EDT ----- Thyroid test = TSH from 07/30/2024 was NORMAL.  Your hair loss does not appear to be related to any Thyroid imbalance. It is likely related to your stomach issues and weight loss. This may recover when all stomach issues settle down and weight stabilizes, but can take up to a year. ----- Message ----- From: Interface, [facility] Lab Results In Sent: 07/31/2024   7:36 AM EDT To: Wendy JAYSON Rhyme, MD

## 2024-08-05 ENCOUNTER — Encounter: Payer: Self-pay | Admitting: Dermatology

## 2025-02-02 ENCOUNTER — Ambulatory Visit: Admitting: Dermatology

## 2025-07-30 ENCOUNTER — Ambulatory Visit: Admitting: Dermatology
